# Patient Record
Sex: Male | Born: 1965 | Race: Black or African American | Hispanic: No | Marital: Married | Smoking: Current every day smoker
Health system: Southern US, Community
[De-identification: ages and names within clinical notes are randomized; demographics above are authoritative.]

## PROBLEM LIST (undated history)

## (undated) ENCOUNTER — Emergency Department (HOSPITAL_BASED_OUTPATIENT_CLINIC_OR_DEPARTMENT_OTHER): Payer: No Typology Code available for payment source | Source: Home / Self Care

## (undated) DIAGNOSIS — I10 Essential (primary) hypertension: Secondary | ICD-10-CM

## (undated) DIAGNOSIS — IMO0002 Reserved for concepts with insufficient information to code with codable children: Secondary | ICD-10-CM

## (undated) DIAGNOSIS — M549 Dorsalgia, unspecified: Secondary | ICD-10-CM

## (undated) HISTORY — PX: KNEE ARTHROSCOPY: SUR90

## (undated) HISTORY — PX: ANKLE FUSION: SHX881

---

## 1998-08-13 ENCOUNTER — Encounter: Payer: Self-pay | Admitting: Emergency Medicine

## 1998-08-13 ENCOUNTER — Emergency Department (HOSPITAL_COMMUNITY): Admission: EM | Admit: 1998-08-13 | Discharge: 1998-08-13 | Payer: Self-pay | Admitting: Emergency Medicine

## 1999-03-05 ENCOUNTER — Emergency Department (HOSPITAL_COMMUNITY): Admission: EM | Admit: 1999-03-05 | Discharge: 1999-03-05 | Payer: Self-pay | Admitting: Emergency Medicine

## 1999-03-05 ENCOUNTER — Encounter: Payer: Self-pay | Admitting: Emergency Medicine

## 1999-05-20 ENCOUNTER — Emergency Department (HOSPITAL_COMMUNITY): Admission: EM | Admit: 1999-05-20 | Discharge: 1999-05-20 | Payer: Self-pay | Admitting: Emergency Medicine

## 1999-05-25 ENCOUNTER — Emergency Department (HOSPITAL_COMMUNITY): Admission: EM | Admit: 1999-05-25 | Discharge: 1999-05-25 | Payer: Self-pay | Admitting: *Deleted

## 2000-09-19 ENCOUNTER — Emergency Department (HOSPITAL_COMMUNITY): Admission: EM | Admit: 2000-09-19 | Discharge: 2000-09-19 | Payer: Self-pay | Admitting: *Deleted

## 2001-04-07 ENCOUNTER — Inpatient Hospital Stay (HOSPITAL_COMMUNITY): Admission: EM | Admit: 2001-04-07 | Discharge: 2001-04-10 | Payer: Self-pay | Admitting: Emergency Medicine

## 2001-04-07 ENCOUNTER — Encounter: Payer: Self-pay | Admitting: Emergency Medicine

## 2001-04-07 ENCOUNTER — Encounter: Payer: Self-pay | Admitting: Orthopedic Surgery

## 2001-04-08 ENCOUNTER — Encounter: Payer: Self-pay | Admitting: Orthopedic Surgery

## 2001-08-05 ENCOUNTER — Encounter: Payer: Self-pay | Admitting: Emergency Medicine

## 2001-08-05 ENCOUNTER — Emergency Department (HOSPITAL_COMMUNITY): Admission: EM | Admit: 2001-08-05 | Discharge: 2001-08-05 | Payer: Self-pay | Admitting: Emergency Medicine

## 2001-12-21 ENCOUNTER — Emergency Department (HOSPITAL_COMMUNITY): Admission: EM | Admit: 2001-12-21 | Discharge: 2001-12-22 | Payer: Self-pay | Admitting: Emergency Medicine

## 2001-12-21 ENCOUNTER — Encounter: Payer: Self-pay | Admitting: Emergency Medicine

## 2002-06-09 ENCOUNTER — Emergency Department (HOSPITAL_COMMUNITY): Admission: EM | Admit: 2002-06-09 | Discharge: 2002-06-09 | Payer: Self-pay | Admitting: Emergency Medicine

## 2002-06-09 ENCOUNTER — Encounter: Payer: Self-pay | Admitting: Emergency Medicine

## 2002-11-10 ENCOUNTER — Emergency Department (HOSPITAL_COMMUNITY): Admission: EM | Admit: 2002-11-10 | Discharge: 2002-11-10 | Payer: Self-pay | Admitting: *Deleted

## 2002-12-02 ENCOUNTER — Emergency Department (HOSPITAL_COMMUNITY): Admission: EM | Admit: 2002-12-02 | Discharge: 2002-12-02 | Payer: Self-pay | Admitting: Emergency Medicine

## 2002-12-02 ENCOUNTER — Encounter: Payer: Self-pay | Admitting: Emergency Medicine

## 2004-07-18 ENCOUNTER — Ambulatory Visit: Payer: Self-pay | Admitting: Nurse Practitioner

## 2004-07-18 ENCOUNTER — Ambulatory Visit: Payer: Self-pay | Admitting: *Deleted

## 2004-09-14 ENCOUNTER — Emergency Department (HOSPITAL_COMMUNITY): Admission: EM | Admit: 2004-09-14 | Discharge: 2004-09-14 | Payer: Self-pay | Admitting: Emergency Medicine

## 2005-07-30 ENCOUNTER — Ambulatory Visit: Payer: Self-pay | Admitting: Internal Medicine

## 2006-01-11 ENCOUNTER — Ambulatory Visit: Payer: Self-pay | Admitting: Internal Medicine

## 2006-10-15 ENCOUNTER — Emergency Department (HOSPITAL_COMMUNITY): Admission: EM | Admit: 2006-10-15 | Discharge: 2006-10-15 | Payer: Self-pay | Admitting: Emergency Medicine

## 2007-07-24 ENCOUNTER — Emergency Department (HOSPITAL_COMMUNITY): Admission: EM | Admit: 2007-07-24 | Discharge: 2007-07-24 | Payer: Self-pay | Admitting: Emergency Medicine

## 2007-10-14 ENCOUNTER — Ambulatory Visit: Payer: Self-pay | Admitting: Internal Medicine

## 2007-10-14 ENCOUNTER — Ambulatory Visit (HOSPITAL_COMMUNITY): Admission: RE | Admit: 2007-10-14 | Discharge: 2007-10-14 | Payer: Self-pay | Admitting: Internal Medicine

## 2009-02-07 ENCOUNTER — Ambulatory Visit: Payer: Self-pay | Admitting: Internal Medicine

## 2009-02-12 ENCOUNTER — Ambulatory Visit (HOSPITAL_COMMUNITY): Admission: RE | Admit: 2009-02-12 | Discharge: 2009-02-12 | Payer: Self-pay | Admitting: Internal Medicine

## 2009-02-14 ENCOUNTER — Emergency Department (HOSPITAL_COMMUNITY): Admission: EM | Admit: 2009-02-14 | Discharge: 2009-02-14 | Payer: Self-pay | Admitting: Emergency Medicine

## 2009-02-21 ENCOUNTER — Ambulatory Visit: Payer: Self-pay | Admitting: Internal Medicine

## 2009-02-25 ENCOUNTER — Ambulatory Visit: Payer: Self-pay | Admitting: Internal Medicine

## 2009-02-26 ENCOUNTER — Encounter: Admission: RE | Admit: 2009-02-26 | Discharge: 2009-02-26 | Payer: Self-pay | Admitting: Internal Medicine

## 2009-03-07 ENCOUNTER — Ambulatory Visit: Payer: Self-pay | Admitting: Internal Medicine

## 2009-03-29 ENCOUNTER — Ambulatory Visit: Payer: Self-pay | Admitting: Internal Medicine

## 2009-04-24 ENCOUNTER — Encounter: Admission: RE | Admit: 2009-04-24 | Discharge: 2009-04-26 | Payer: Self-pay | Admitting: Internal Medicine

## 2009-04-28 ENCOUNTER — Emergency Department (HOSPITAL_COMMUNITY): Admission: EM | Admit: 2009-04-28 | Discharge: 2009-04-28 | Payer: Self-pay | Admitting: Emergency Medicine

## 2009-04-29 ENCOUNTER — Encounter: Admission: RE | Admit: 2009-04-29 | Discharge: 2009-05-21 | Payer: Self-pay | Admitting: Internal Medicine

## 2009-06-04 ENCOUNTER — Ambulatory Visit: Payer: Self-pay | Admitting: Internal Medicine

## 2009-10-17 ENCOUNTER — Emergency Department (HOSPITAL_COMMUNITY): Admission: EM | Admit: 2009-10-17 | Discharge: 2009-10-17 | Payer: Self-pay | Admitting: Family Medicine

## 2010-01-26 ENCOUNTER — Emergency Department (HOSPITAL_COMMUNITY): Admission: EM | Admit: 2010-01-26 | Discharge: 2010-01-26 | Payer: Self-pay | Admitting: Family Medicine

## 2010-04-17 ENCOUNTER — Emergency Department (HOSPITAL_COMMUNITY)
Admission: EM | Admit: 2010-04-17 | Discharge: 2010-04-17 | Payer: Self-pay | Source: Home / Self Care | Admitting: Family Medicine

## 2010-05-30 ENCOUNTER — Other Ambulatory Visit: Payer: Self-pay | Admitting: Family Medicine

## 2010-05-30 DIAGNOSIS — M5136 Other intervertebral disc degeneration, lumbar region: Secondary | ICD-10-CM

## 2010-06-02 ENCOUNTER — Ambulatory Visit
Admission: RE | Admit: 2010-06-02 | Discharge: 2010-06-02 | Disposition: A | Discharge status: Home / Self Care | Payer: Self-pay | Source: Ambulatory Visit | Attending: Family Medicine | Admitting: Family Medicine

## 2010-06-02 DIAGNOSIS — M5136 Other intervertebral disc degeneration, lumbar region: Secondary | ICD-10-CM

## 2010-07-13 LAB — POCT CARDIAC MARKERS
CKMB, poc: <1 ng/mL — ABNORMAL LOW (ref 1.0–8.0)
Myoglobin, poc: 20.6 ng/mL (ref 12–200)
Troponin i, poc: <0.05 ng/mL (ref 0.00–0.09)

## 2010-07-13 LAB — DIFFERENTIAL
Basophils Absolute: 0 10*3/uL (ref 0.0–0.1)
Basophils Relative: 0 % (ref 0–1)
Eosinophils Relative: 3 % (ref 0–5)
Lymphocytes Relative: 34 % (ref 12–46)
Monocytes Absolute: 0.6 10*3/uL (ref 0.1–1.0)

## 2010-07-13 LAB — D-DIMER, QUANTITATIVE: D-Dimer, Quant: <0.22 ug/mL-FEU (ref 0.00–0.48)

## 2010-07-13 LAB — CBC
Hemoglobin: 14.9 g/dL (ref 13.0–17.0)
MCHC: 34 g/dL (ref 30.0–36.0)
MCV: 90.8 fL (ref 78.0–100.0)
RDW: 14.2 % (ref 11.5–15.5)

## 2010-07-13 LAB — BASIC METABOLIC PANEL
Creatinine, Ser: 0.92 mg/dL (ref 0.4–1.5)
GFR calc non Af Amer: >60 mL/min (ref >60)
Sodium: 136 mEq/L (ref 135–145)

## 2010-07-14 LAB — GC/CHLAMYDIA PROBE AMP, URINE
Chlamydia, Swab/Urine, PCR: NEGATIVE
GC Probe Amp, Urine: NEGATIVE

## 2010-09-12 NOTE — Discharge Summary (Signed)
[place]. [facility]  Patient:    Brady Kim, Brady Kim Visit Number: [id number] MRN: [id number]          Service Type: MED Location: 5000 5009 01 Attending Physician:  Drema Pry Dictated by:   Anise Salvo. Clark, P.A.C. Admit Date:  04/07/2001 Discharge Date: 04/10/2001                             Discharge Summary  HOSPITAL COURSE:  Brady Kim was admitted to [facility] with a one-day history of right leg and ankle pain after suffering a fall off the roof while stringing up Christmas lights.  He felt immediate pain, there was swelling and ecchymosis on his ankle, he was unable to bear weight.  He presented to the emergency room where x-rays were taken revealing a right trimalleolar ankle fracture.  He was admitted to the hospital to Dr. Philipp Ovens service and set up for an open reduction internal fixation of his right trimalleolar ankle fracture on April 08, 2001.  He was brought to the operating room, a tourniquet was placed on his right leg.  The leg was prepped in a sterile fashion.  The tourniquet was inflated to 350 mmHg and the medial malleolus and lateral malleolus were openly reduced and fixed.  Total tourniquet time was one hour 30 minutes.  He was placed in a splint and made nonweightbearing, was admitted to the floor with PCA anesthesia.  Physical therapy was consulted to assist him with ambulation, he was given crutches.  Significant labs during his postoperative period revealed hemoglobin and hematocrit on December 12 were 15.1 and 44; on December 13 were 13.2 and 39; and on December 14 were 13.4 and 38.9.  On December 14 he was also noted to have a white blood count of 12.0 with 84% neutrophils.  His INR went from 1.0 on December 12 to 1.3 on December 13.  The remainder of his postoperative course is uneventful.  He was discharged on December 15.  MEDICATIONS UPON DISCHARGE:  Mepergan Fortis one or two tablets q.4-6h.  p.r.n. pain.  ACTIVITY:  To use crutches, no weightbearing on the right leg.  SPECIAL INSTRUCTIONS:  He was instructed to keep the dressing dry, keep his leg elevated, use ice as needed.  FOLLOW-UP:  Instructed to follow up at Dr. Philipp Ovens office in one week - he was to call for an appointment.  CONDITION UPON DISCHARGE:  Stable. Dictated by:   Anise Salvo. Clark, P.A.C. Attending Physician:  Drema Pry DD:  05/02/01 TD:  05/02/01 Job: [id number]

## 2010-09-12 NOTE — Op Note (Signed)
. [facility]  Patient:    Brady Kim, Brady Kim Visit Number: [id number] MRN: [id number]          Service Type: MED Location: 5000 5009 01 Attending Physician:  Drema Pry Dictated by:   Jearld Adjutant, M.D. Admit Date:  04/07/2001                             Operative Report  NO DICTATION. Dictated by:   Jearld Adjutant, M.D. Attending Physician:  Drema Pry DD:  04/08/01 TD:  04/09/01 Job: [id number]

## 2010-09-12 NOTE — H&P (Signed)
[place]. [facility]  Patient:    Brady Kim, Brady Kim Visit Number: [id number] MRN: [id number]          Service Type: MED Location: 5000 5009 01 Attending Physician:  Drema Pry Dictated by:   Kittie Plater Clark, P.A.-C. Admit Date:  04/07/2001                           History and Physical  CHIEF COMPLAINT:  Right leg/ankle pain x 1 day.  HISTORY OF PRESENT ILLNESS:  A 45 year old male fell off a roof while stringing Christmas lights last night.  He fell approximately 13 to 14 feet to ground.  Felt and heard a snap in his right ankle.  Felt immediate pain, swelling, and ecchymoses.  Was unable to bear weight on his right ankle.  He has no prior history of ankle trauma.  He has no hip pain, no knee pain, no pain on the side of the leg.  There is no loss of consciousness.  Suffered no head trauma.  He has had no headaches, no nausea, and no vomiting.  PAST MEDICAL HISTORY:  Denies any significant medical history.  Specifically denies any diabetes mellitus, heart disease, kidney disease, liver disease, of hypertension.  MEDICATIONS:  He occasionally takes Tylenol.  He denies other medications.  ALLERGIES:  He has no known drug allergies.  PHYSICIAN:  He states he does not have a primary doctor.  PAST SURGICAL HISTORY:  Significant for a left knee arthroscopy approximately 10 to 20 years ago for "torn cartilage."  SOCIAL HISTORY:  Tobacco:  Positive for one pack per day x 20 years.  Ethanol: Positive for two to three quarts or beer per day approximately three to seven times per week.  Denies any other drugs.  He lives on a one floor home.  There are five steps up to the front door.  He lives with his significant other.  REVIEW OF SYSTEMS:  He denies any ENT, cardiac, pulmonary, GI, or psychological symptoms.  He has had polyuria since he has been in the hospital.  This may be due to his IV and he has had excessive thirst but he has also been  NPO.  PHYSICAL EXAMINATION:  GENERAL:  Well-developed, well-nourished black male in no acute distress. Foot is splinted, iced, and elevated.  VITAL SIGNS:  Reviewed on the chart, temperature 98.2, pulse of 80, respiration 20, blood pressure 130/90.  HEENT:  NCAT.  PERRLA.  EOMI.  NECK:  Supple.  LUNGS:  Clear to auscultation.  HEART:  Regular rate and rhythm.  Normal S1 and S2.  No murmurs noted.  ABDOMEN:  Soft and nontender.  EXTREMITIES:  Right lower extremity motor and sensory to toes was intact. Capillary refill was less than one second.  His leg was iced and elevated. The right knee was not tender.  Right hip had a full range of motion as well.  LABORATORY DATA:  White count was 9, hemoglobin 13, hematocrit 39, platelets 204,000.  Sodium was 139, potassium 3.7, chloride 105, CO2 27, BUN 6, creatinine 0.8, glucose 97.  He had SGOT of 21, SGPT was elevated at 44, and alkaline phosphatase was elevated at 119.  PT/INR was 15.3/1.3.  PTT was 30. His urinalysis was negative.  X-rays revealed a trimalleolar fracture of the right ankle.  IMPRESSION:  Trimalleolar fracture of the right ankle.  PLAN:  Admit to [facility] for ORIF of right ankle. Dictated  by:   Kittie Plater Clark, P.A.-C. Attending Physician:  Drema Pry DD:  04/08/01 TD:  04/08/01 Job: [id number]

## 2010-09-12 NOTE — Op Note (Signed)
[place]. [facility]  Patient:    Brady Kim, Brady Kim Visit Number: [id number] MRN: [id number]          Service Type: MED Location: 5000 5009 01 Attending Physician:  Drema Pry Dictated by:   Jearld Adjutant, M.D. Admit Date:  04/07/2001                             Operative Report  NO DICTATION. Dictated by:   Jearld Adjutant, M.D. Attending Physician:  Drema Pry DD:  04/08/01 TD:  04/09/01 Job: [id number]

## 2010-09-12 NOTE — Op Note (Signed)
[place]. [facility]  Patient:    Brady Kim, Brady Kim Visit Number: [id number] MRN: [id number]          Service Type: MED Location: 5000 5009 01 Attending Physician:  Drema Pry Dictated by:   Jearld Adjutant, M.D. Proc. Date: 04/08/01 Admit Date:  04/07/2001   CC:         Jearld Adjutant, M.D.   Operative Report  PREOPERATIVE DIAGNOSIS:  Closed right trimalleolar right ankle fracture displaced.  POSTOPERATIVE DIAGNOSIS:  Closed right trimalleolar right ankle fracture displaced.  OPERATION:  Open reduction and internal fixation of bimalleolar component of trimalleolar fracture.  SURGEON:  Jearld Adjutant, M.D.  ASSISTANT:  Luretha Rued, P.A.-C.  ANESTHESIA:  General endotracheal anesthesia.  CULTURES:    None.  DRAINS:  None.  ESTIMATED BLOOD LOSS:  Minimal.  TOURNIQUET TIME:  1 hour and 30 minutes.  INDICATION:  Brady Kim is a 45 year old male who slipped off the roof putting Christmas lights up at a neighbors house sustaining this fracture.  MVS was intact.  He was splinted and admitted for elevation.  In surgery, no untorrid features were noted.  We fixed the medial malleolus with two 4.5 cannulated cancellous screws and the lateral malleolus with three lag screws in the spiral fracture-cortical type and an 8 hole one-third tubular plate contoured for the fibula to act as a buttress with 8 cortices proximal and two at or distal to the fracture site.  One was cancellous. Anatomic reduction was obtained.  On mortis and lateral view, the posterior malleolus was approximately 25% of the surface and reduced with the reduction.  DESCRIPTION OF PROCEDURE:  With excellent anesthesia obtained using endotracheal technique, 1 gm Ancef given IV prophylaxis.  The patient was placed in the supine position.  The right focus area was prepped from the toes to the knee in the standard fashion.  After standard prepping and draping, Esmarch  exsanguination was used and the tourniquet was let up to 350 mmHg.  We then made an incision over the medial malleolus.  Dissection carried down to the fracture site and clot evacuated and the joint flushed.  Debride was removed from the fracture as well as the periosteum.  The fracture was then reduced with the bone holding clamp and two guide pins were placed in the appropriate position.  C-arm fluoroscopy confirmed position and then two 46 mm 4.5 cannulated cancellous screws short thread were used to fix the medial malleolus anatomically.  Attention was then turned laterally where an incision was made over the distal fibula.  Incision was deep and sharp with the knife and hemostasis obtained using the Bovie electrocoagulator.  The spiral distal fracture had the debride removed and it was reduced with a bone-holding clamp.  Two anterior-posterior cortical lag screws were then placed with lag technique and one in the lateral medial plane more proximal.  C-arm fluoroscopy confirmed reduction and then a contoured eight hole semi-third tubular plate was placed with eight cortices proximal as above, contoured to fit the fibula.  C-arm fluoroscopy on mortis and true lateral position was taken.  We were satisfied with the reduction.  Irrigation was carried out in both wounds.  The wounds were closed in layers with 2-0 and 3-0 Vicryl and skin staples.  A bulky sterile compressive dressing was applied with posterior plaster splint with U stirrup, Ace, and Webril in neutral ankle position.  The patient, having tolerated the procedure well, was awakened and taken to the recovery room  in satisfactory condition to be admitted for routine postoperative care, nonweightbearing, crutch ambulation, and analgesia. Dictated by:   Jearld Adjutant, M.D. Attending Physician:  Drema Pry DD:  04/08/01 TD:  04/09/01 Job: [id number]

## 2010-12-21 ENCOUNTER — Inpatient Hospital Stay (INDEPENDENT_AMBULATORY_CARE_PROVIDER_SITE_OTHER)
Admission: RE | Admit: 2010-12-21 | Discharge: 2010-12-21 | Disposition: A | Discharge status: Home / Self Care | Payer: Self-pay | Source: Ambulatory Visit | Attending: Emergency Medicine | Admitting: Emergency Medicine

## 2010-12-21 DIAGNOSIS — M545 Low back pain: Secondary | ICD-10-CM

## 2010-12-21 DIAGNOSIS — R300 Dysuria: Secondary | ICD-10-CM

## 2010-12-21 LAB — POCT URINALYSIS DIP (DEVICE)
Hgb urine dipstick: NEGATIVE
Leukocytes, UA: NEGATIVE
Nitrite: NEGATIVE
pH: 6 (ref 5.0–8.0)

## 2010-12-22 LAB — GC/CHLAMYDIA PROBE AMP, GENITAL: GC Probe Amp, Genital: NEGATIVE

## 2011-05-30 ENCOUNTER — Emergency Department (HOSPITAL_COMMUNITY)
Admission: EM | Admit: 2011-05-30 | Discharge: 2011-05-30 | Disposition: A | Discharge status: Home / Self Care | Payer: Self-pay | Source: Home / Self Care | Attending: Family Medicine | Admitting: Family Medicine

## 2011-05-30 ENCOUNTER — Encounter (HOSPITAL_COMMUNITY): Payer: Self-pay | Admitting: *Deleted

## 2011-05-30 DIAGNOSIS — K047 Periapical abscess without sinus: Secondary | ICD-10-CM

## 2011-05-30 HISTORY — DX: Dorsalgia, unspecified: M54.9

## 2011-05-30 HISTORY — DX: Reserved for concepts with insufficient information to code with codable children: IMO0002

## 2011-05-30 MED ORDER — HYDROCODONE-ACETAMINOPHEN 5-325 MG PO TABS
2.0000 | ORAL_TABLET | Freq: Once | ORAL | Status: AC
  Administered 2011-05-30: 2 via ORAL

## 2011-05-30 MED ORDER — HYDROCODONE-ACETAMINOPHEN 5-325 MG PO TABS
ORAL_TABLET | ORAL | Status: AC
  Filled 2011-05-30: qty 2

## 2011-05-30 MED ORDER — HYDROCODONE-ACETAMINOPHEN 5-325 MG PO TABS: ORAL_TABLET | ORAL | Status: AC

## 2011-05-30 MED ORDER — CLINDAMYCIN HCL 150 MG PO CAPS: 150.0000 mg | ORAL_CAPSULE | Freq: Four times a day (QID) | ORAL | Status: AC

## 2011-05-30 NOTE — ED Notes (Signed)
Pt with c/o right upper toothache onset yesterday this am with swelling right side face increased pain

## 2011-05-30 NOTE — ED Provider Notes (Signed)
History     CSN: [id number]  Arrival date & time 05/30/11  1222   First MD Initiated Contact with Patient 05/30/11 1348      Chief Complaint  Patient presents with  . Dental Pain  . Oral Swelling    (Consider location/radiation/quality/duration/timing/severity/associated sxs/prior treatment) HPI Comments: Brady Kim presents for evaluation of sudden onset of pain and swelling in the right upper jaw. He reports that this started yesterday suddenly. He denies any specific injury but does have a history of poor dentition.  Patient is a 46 y.o. male presenting with tooth pain. The history is provided by the patient.  Dental PainThe primary symptoms include mouth pain. Primary symptoms do not include dental injury. The symptoms began yesterday. The symptoms are worsening. The symptoms are new.  Mouth pain occurs constantly. Mouth pain is unchanged. Affected locations include: gum(s) and cheek(s).  Additional symptoms include: gum swelling, gum tenderness and facial swelling. Additional symptoms do not include: purulent gums and dry mouth. Medical issues include: smoking and periodontal disease.    Past Medical History  Diagnosis Date  . Back pain   . Degenerative disc disease     Past Surgical History  Procedure Date  . Knee arthroscopy   . Ankle fusion     History reviewed. No pertinent family history.  History  Substance Use Topics  . Smoking status: Current Everyday Smoker  . Smokeless tobacco: Not on file  . Alcohol Use: No      Review of Systems  Constitutional: Negative.   HENT: Positive for facial swelling and dental problem.   Eyes: Negative.   Respiratory: Negative.   Cardiovascular: Negative.   Gastrointestinal: Negative.   Genitourinary: Negative.   Musculoskeletal: Negative.   Skin: Negative.   Neurological: Negative.     Allergies  Review of patient's allergies indicates no known allergies.  Home Medications   Current Outpatient Rx  Name Route Sig  Dispense Refill  . BENZOCAINE 10 % MT GEL Mouth/Throat Use as directed in the mouth or throat as needed.    Marland Kitchen CLINDAMYCIN HCL 150 MG PO CAPS Oral Take 1 capsule (150 mg total) by mouth every 6 (six) hours. 28 capsule 0  . HYDROCODONE-ACETAMINOPHEN 5-325 MG PO TABS  Take one to two tablets every 4 to 6 hours as needed for pain 20 tablet 0    BP 125/74  Pulse 93  Temp(Src) 99.4 F (37.4 C) (Oral)  Resp 16  SpO2 98%  Physical Exam  Nursing note and vitals reviewed. Constitutional: He is oriented to person, place, and time. He appears well-developed and well-nourished.  HENT:  Head: Normocephalic and atraumatic.  Mouth/Throat: Uvula is midline and mucous membranes are normal. Abnormal dentition. Dental abscesses and dental caries present.  Eyes: EOM are normal.  Neck: Normal range of motion.  Pulmonary/Chest: Effort normal.  Musculoskeletal: Normal range of motion.  Neurological: He is alert and oriented to person, place, and time.  Skin: Skin is warm and dry.  Psychiatric: His behavior is normal.    ED Course  Procedures (including critical care time)  Labs Reviewed - No data to display No results found.   1. Periapical abscess       MDM  rx given for clindamycin, hydrocodone and referred to [facility]; given contact information       Richardo Priest, MD 05/30/11 1438

## 2011-11-06 ENCOUNTER — Other Ambulatory Visit (HOSPITAL_COMMUNITY): Payer: Self-pay | Admitting: Internal Medicine

## 2011-11-06 DIAGNOSIS — R519 Headache, unspecified: Secondary | ICD-10-CM

## 2011-11-09 ENCOUNTER — Other Ambulatory Visit (HOSPITAL_COMMUNITY): Payer: Self-pay | Admitting: Internal Medicine

## 2011-11-09 ENCOUNTER — Other Ambulatory Visit (HOSPITAL_COMMUNITY): Payer: Self-pay | Admitting: Family Medicine

## 2011-11-09 ENCOUNTER — Ambulatory Visit (HOSPITAL_COMMUNITY)
Admission: RE | Admit: 2011-11-09 | Discharge: 2011-11-09 | Disposition: A | Discharge status: Home / Self Care | Payer: Self-pay | Source: Ambulatory Visit | Attending: Internal Medicine | Admitting: Internal Medicine

## 2011-11-09 DIAGNOSIS — R51 Headache: Secondary | ICD-10-CM | POA: Insufficient documentation

## 2012-12-11 ENCOUNTER — Emergency Department (HOSPITAL_COMMUNITY)
Admission: EM | Admit: 2012-12-11 | Discharge: 2012-12-11 | Disposition: A | Discharge status: Home / Self Care | Payer: Self-pay | Attending: Emergency Medicine | Admitting: Emergency Medicine

## 2012-12-11 ENCOUNTER — Encounter (HOSPITAL_COMMUNITY): Payer: Self-pay | Admitting: Emergency Medicine

## 2012-12-11 DIAGNOSIS — M549 Dorsalgia, unspecified: Secondary | ICD-10-CM

## 2012-12-11 DIAGNOSIS — R52 Pain, unspecified: Secondary | ICD-10-CM | POA: Insufficient documentation

## 2012-12-11 DIAGNOSIS — Z87828 Personal history of other (healed) physical injury and trauma: Secondary | ICD-10-CM | POA: Insufficient documentation

## 2012-12-11 DIAGNOSIS — Z8669 Personal history of other diseases of the nervous system and sense organs: Secondary | ICD-10-CM | POA: Insufficient documentation

## 2012-12-11 DIAGNOSIS — F17200 Nicotine dependence, unspecified, uncomplicated: Secondary | ICD-10-CM | POA: Insufficient documentation

## 2012-12-11 DIAGNOSIS — G8929 Other chronic pain: Secondary | ICD-10-CM | POA: Insufficient documentation

## 2012-12-11 DIAGNOSIS — R209 Unspecified disturbances of skin sensation: Secondary | ICD-10-CM | POA: Insufficient documentation

## 2012-12-11 DIAGNOSIS — M545 Low back pain, unspecified: Secondary | ICD-10-CM | POA: Insufficient documentation

## 2012-12-11 DIAGNOSIS — Z8739 Personal history of other diseases of the musculoskeletal system and connective tissue: Secondary | ICD-10-CM | POA: Insufficient documentation

## 2012-12-11 MED ORDER — TRAMADOL HCL 50 MG PO TABS: 50.0000 mg | ORAL_TABLET | Freq: Four times a day (QID) | ORAL | Status: DC | PRN
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
## 2012-12-11 NOTE — ED Notes (Signed)
Pt alert, arrives from home, c/o low back pain onset chronic in nature, denies recent trauma or injury, resp even unlabored, skin pwd

## 2012-12-13 NOTE — ED Provider Notes (Signed)
Medical screening examination/treatment/procedure(s) were performed by non-physician practitioner and as supervising physician I was immediately available for consultation/collaboration.   Laquiesha Piacente W. Glendola Friedhoff, MD 12/13/12 0853 

## 2013-02-20 ENCOUNTER — Ambulatory Visit: Payer: No Typology Code available for payment source | Attending: Internal Medicine

## 2013-03-10 ENCOUNTER — Ambulatory Visit: Payer: No Typology Code available for payment source | Attending: Internal Medicine | Admitting: Internal Medicine

## 2013-03-10 ENCOUNTER — Encounter: Payer: Self-pay | Admitting: Internal Medicine

## 2013-03-10 VITALS — BP 128/86 | HR 75 | Temp 98.3°F | Resp 16 | Ht 71.0 in | Wt 154.0 lb

## 2013-03-10 DIAGNOSIS — M545 Low back pain: Secondary | ICD-10-CM

## 2013-03-10 DIAGNOSIS — Z0000 Encounter for general adult medical examination without abnormal findings: Secondary | ICD-10-CM

## 2013-03-10 DIAGNOSIS — Z23 Encounter for immunization: Secondary | ICD-10-CM

## 2013-03-10 MED ORDER — ALBUTEROL SULFATE HFA 108 (90 BASE) MCG/ACT IN AERS: 2.0000 | INHALATION_SPRAY | Freq: Four times a day (QID) | RESPIRATORY_TRACT | Status: DC | PRN

## 2013-03-10 MED ORDER — NAPROXEN 500 MG PO TABS: 500.0000 mg | ORAL_TABLET | Freq: Two times a day (BID) | ORAL | Status: DC

## 2013-03-10 MED ORDER — CARISOPRODOL 350 MG PO TABS: 350.0000 mg | ORAL_TABLET | Freq: Every day | ORAL | Status: DC

## 2013-03-10 MED ORDER — FAMOTIDINE 20 MG PO TABS: 20.0000 mg | ORAL_TABLET | Freq: Two times a day (BID) | ORAL | Status: DC

## 2013-03-10 NOTE — Progress Notes (Unsigned)
Pt is here to establish care. Pt needs forms to be filled out. He has chronic lumbar back pain and arthritis. He is requesting an MRI of his back.

## 2013-03-10 NOTE — Progress Notes (Unsigned)
Patient ID: Brady Kim, male   DOB: 02-09-1966, 47 y.o.   MRN: [id number]   HPI: This is a 47 year old male with history of chronic lower back pain who comes in to establish care. The patient states that he was previously seeing Dr. Roseanne Reno at the [facility] clinic and was getting prescriptions for narcotics. He is not asking for narcotics today. He states that pain is mostly just lateral to his lumbar spine on both sides but at times radiates down his legs. Currently he is not having any pain. He states that he injured L2/L3 many years ago due to the kind of work he does which includes heavy lifting and moving. Also brought in paperwork from the department of transportation for [place] to fill. He states that he's had a DUI in the past and he suspects this is why he is having this paperwork sent to him.  No Known Allergies Past Medical History  Diagnosis Date  . Back pain   . Degenerative disc disease     Family History  Problem Relation Age of Onset  . Hypertension Mother   . Diabetes Maternal Aunt   . Cancer Maternal Aunt   . Cancer Maternal Grandmother    History   Social History  . Marital Status: Married    Spouse Name: N/A    Number of Children: N/A  . Years of Education: N/A   Occupational History  . Not on file.   Social History Main Topics  . Smoking status: Current Every Day Smoker -- 0.50 packs/day    Types: Cigarettes  . Smokeless tobacco: Not on file  . Alcohol Use: No  . Drug Use: No  . Sexual Activity: Not on file   Other Topics Concern  . Not on file   Social History Narrative  . No narrative on file    Review of Systems  Constitutional: Negative for fever, chills, diaphoresis, activity change, appetite change and fatigue.  HENT: Negative for ear pain, nosebleeds, congestion, facial swelling, rhinorrhea, neck pain, neck stiffness and ear discharge.  Eyes: Negative for pain, discharge, redness, itching and visual disturbance.  Respiratory: Negative for  cough, choking, chest tightness, shortness of breath, wheezing and stridor.  Cardiovascular: Negative for chest pain, palpitations and leg swelling.  Gastrointestinal: Negative for abdominal distention.  Genitourinary: Negative for dysuria, urgency, frequency, hematuria, flank pain, decreased urine volume, difficulty urinating and dyspareunia.  Musculoskeletal: positive for back pain, no joint swelling, arthralgias or gait problem.  Neurological: Negative for dizziness, tremors, seizures, syncope, facial asymmetry, speech difficulty, weakness, light-headedness, numbness and headaches.  Hematological: Negative for adenopathy. Does not bruise/bleed easily.  Psychiatric/Behavioral: Negative for hallucinations, behavioral problems, confusion, dysphoric mood, decreased concentration and agitation.    Objective:   Filed Vitals:   03/10/13 1543  BP: 128/86  Pulse: 75  Temp: 98.3 F (36.8 C)  Resp: 16   Filed Weights   03/10/13 1543  Weight: 154 lb (69.854 kg)    Physical Exam ______ Constitutional: Appears well-developed and well-nourished. No distress. ____ HENT: Normocephalic. External right and left ear normal. Oropharynx is clear and moist. ____ Eyes: Conjunctivae and EOM are normal. PERRLA, no scleral icterus. ____ Neck: Normal ROM. Neck supple. No JVD. No tracheal deviation. No thyromegaly. ____ CVS: RRR, S1/S2 +, no murmurs, no gallops, no carotid bruit.  Pulmonary: Effort and breath sounds normal, no stridor, rhonchi, wheezes, rales.  Abdominal: Soft. BS +,  no distension, tenderness, rebound or guarding. ________ Musculoskeletal: Normal range of motion. No edema  and no tenderness. Mild tenderness in the paraspinal area of upper lumbar spine.  Neuro: Alert. Normal reflexes, muscle tone coordination. No cranial nerve deficit. Skin: Skin is warm and dry. No rash noted. Not diaphoretic. No erythema. No pallor. ____ Psychiatric: Normal mood and affect. Behavior, judgment, thought  content normal. __  Lab Results  Component Value Date   WBC 6.7 04/28/2009   HGB 14.9 04/28/2009   HCT 44.0 04/28/2009   MCV 90.8 04/28/2009   PLT 191 04/28/2009   Lab Results  Component Value Date   CREATININE 0.92 04/28/2009   BUN 7 04/28/2009   NA 136 04/28/2009   K 4.0 04/28/2009   CL 102 04/28/2009   CO2 28 04/28/2009    No results found for this basename: HGBA1C   Lipid Panel  No results found for this basename: chol, trig, hdl, cholhdl, vldl, ldlcalc        There are no active problems to display for this patient.    Preventative Medicine:  Flu vaccine: Will be administered   LABS: The following will be performed when he returns in a fasting state Metabolic panel: CBC:  Vitamin D : Lipid Panel: TSH:    Assessment and plan:  Lower back pain -Start Naprosyn twice a day along with Pepcid and soma (only at bedtime) -Referral to sports medicine and physical therapy -X-ray lumbar spine  Paperwork for a Department of Transportation will be filled and returned to him on the day that he comes in for his lab work.  Nicotine abuse Counseling has been given  Calvert Cantor, MD

## 2013-03-13 ENCOUNTER — Ambulatory Visit (HOSPITAL_COMMUNITY)
Admission: RE | Admit: 2013-03-13 | Discharge: 2013-03-13 | Disposition: A | Discharge status: Home / Self Care | Payer: No Typology Code available for payment source | Source: Ambulatory Visit | Attending: Internal Medicine | Admitting: Internal Medicine

## 2013-03-13 DIAGNOSIS — M545 Low back pain, unspecified: Secondary | ICD-10-CM

## 2013-03-13 DIAGNOSIS — Z0000 Encounter for general adult medical examination without abnormal findings: Secondary | ICD-10-CM

## 2013-03-13 DIAGNOSIS — M79609 Pain in unspecified limb: Secondary | ICD-10-CM | POA: Insufficient documentation

## 2013-03-14 ENCOUNTER — Other Ambulatory Visit: Payer: Self-pay | Admitting: Internal Medicine

## 2013-03-14 ENCOUNTER — Ambulatory Visit: Payer: No Typology Code available for payment source | Attending: Internal Medicine

## 2013-03-14 DIAGNOSIS — Z0000 Encounter for general adult medical examination without abnormal findings: Secondary | ICD-10-CM

## 2013-03-14 DIAGNOSIS — M545 Low back pain: Secondary | ICD-10-CM

## 2013-03-14 LAB — BASIC METABOLIC PANEL
CO2: 30 mEq/L (ref 19–32)
Chloride: 102 mEq/L (ref 96–112)
Creat: 0.95 mg/dL (ref 0.50–1.35)
Glucose, Bld: 96 mg/dL (ref 70–99)
Potassium: 4.4 mEq/L (ref 3.5–5.3)

## 2013-03-14 LAB — LIPID PANEL
Cholesterol: 101 mg/dL (ref 0–200)
HDL: 46 mg/dL (ref >39)
LDL Cholesterol: 35 mg/dL (ref 0–99)
Triglycerides: 102 mg/dL (ref <150)
VLDL: 20 mg/dL (ref 0–40)

## 2013-03-14 LAB — CBC WITH DIFFERENTIAL/PLATELET
Basophils Relative: 1 % (ref 0–1)
Eosinophils Absolute: 0.3 10*3/uL (ref 0.0–0.7)
Eosinophils Relative: 4 % (ref 0–5)
Hemoglobin: 15.2 g/dL (ref 13.0–17.0)
MCH: 29.9 pg (ref 26.0–34.0)
MCHC: 34.7 g/dL (ref 30.0–36.0)
MCV: 86.1 fL (ref 78.0–100.0)
Monocytes Absolute: 0.5 10*3/uL (ref 0.1–1.0)
Monocytes Relative: 7 % (ref 3–12)
Neutrophils Relative %: 61 % (ref 43–77)
RBC: 5.09 MIL/uL (ref 4.22–5.81)
WBC: 6.4 10*3/uL (ref 4.0–10.5)

## 2013-03-14 LAB — PSA: PSA: 1.05 ng/mL (ref <=4.00)

## 2013-03-14 LAB — TSH: TSH: 1.766 u[IU]/mL (ref 0.350–4.500)

## 2013-03-14 LAB — HEMOGLOBIN A1C: Mean Plasma Glucose: 123 mg/dL — ABNORMAL HIGH (ref <117)

## 2013-03-15 ENCOUNTER — Encounter: Payer: Self-pay | Admitting: Family Medicine

## 2013-03-15 ENCOUNTER — Ambulatory Visit (INDEPENDENT_AMBULATORY_CARE_PROVIDER_SITE_OTHER): Payer: No Typology Code available for payment source | Admitting: Family Medicine

## 2013-03-15 VITALS — BP 113/79 | HR 101 | Ht 71.0 in | Wt 154.0 lb

## 2013-03-15 DIAGNOSIS — G8929 Other chronic pain: Secondary | ICD-10-CM

## 2013-03-15 DIAGNOSIS — M545 Low back pain: Secondary | ICD-10-CM

## 2013-03-15 MED ORDER — PREDNISONE (PAK) 10 MG PO TABS: ORAL_TABLET | ORAL | Status: DC

## 2013-03-15 NOTE — Patient Instructions (Addendum)
You have underlying arthritis and a herniated disc in your back from prior MRI. We will get an updated MRI to see if this has progressed. Take tylenol for baseline pain relief (1-2 extra strength tabs 3x/day) A prednisone dose pack is the best option for immediate relief and may be prescribed. Day after finishing prednisone start aleve 2 tabs twice a day with food for pain and inflammation. Continue soma as needed for muscle spasms. Stay as active as possible. Physical therapy has been shown to be helpful as well - consider repeating this. Strengthening of low back muscles, abdominal musculature are key for long term pain relief. We will look into a neurosurgery referral but this is difficult through the [facility] system.

## 2013-03-16 ENCOUNTER — Encounter: Payer: Self-pay | Admitting: Family Medicine

## 2013-03-16 DIAGNOSIS — G8929 Other chronic pain: Secondary | ICD-10-CM | POA: Insufficient documentation

## 2013-03-16 LAB — DRUGS OF ABUSE SCREEN W/O ALC, ROUTINE URINE
Amphetamine Screen, Ur: NEGATIVE
Barbiturate Quant, Ur: NEGATIVE
Benzodiazepines.: NEGATIVE
Cocaine Metabolites: NEGATIVE
Creatinine,U: 189 mg/dL
Marijuana Metabolite: NEGATIVE
Phencyclidine (PCP): NEGATIVE
Propoxyphene: NEGATIVE

## 2013-03-16 NOTE — Progress Notes (Addendum)
Patient ID: Brady Kim, male   DOB: Oct 05, 1965, 47 y.o.   MRN: [id number]  PCP: Jeanann Lewandowsky, MD  Subjective:   HPI: Patient is a 47 y.o. male here for low back pain.  Patient has struggled with back pain for over 7 years. Constant pain every day with worsening at times. Does a lot of lifting, moving furniture, working on cars. Occasional sharp pain into left leg with tingling. No bowel/bladder dysfunction. Has tried multiple things for this in past including 4 ESIs, physical therapy (states this made it worse), nsaids, muscle relaxants, pain medications.  Past Medical History  Diagnosis Date  . Back pain   . Degenerative disc disease     Current Outpatient Prescriptions on File Prior to Visit  Medication Sig Dispense Refill  . benzocaine (ORAJEL) 10 % mucosal gel Use as directed in the mouth or throat as needed.      . carisoprodol (SOMA) 350 MG tablet Take 1 tablet (350 mg total) by mouth at bedtime.  30 tablet  0  . famotidine (PEPCID) 20 MG tablet Take 1 tablet (20 mg total) by mouth 2 (two) times daily.  60 tablet  6  . naproxen (NAPROSYN) 500 MG tablet Take 1 tablet (500 mg total) by mouth 2 (two) times daily with a meal.  30 tablet  6  . traMADol (ULTRAM) 50 MG tablet Take 1 tablet (50 mg total) by mouth every 6 (six) hours as needed for pain.  20 tablet  0   No current facility-administered medications on file prior to visit.    Past Surgical History  Procedure Laterality Date  . Knee arthroscopy    . Ankle fusion      No Known Allergies  History   Social History  . Marital Status: Married    Spouse Name: N/A    Number of Children: N/A  . Years of Education: N/A   Occupational History  . Not on file.   Social History Main Topics  . Smoking status: Current Every Day Smoker -- 1.00 packs/day    Types: Cigarettes  . Smokeless tobacco: Not on file  . Alcohol Use: No  . Drug Use: No  . Sexual Activity: Not on file   Other Topics Concern  . Not on  file   Social History Narrative  . No narrative on file    Family History  Problem Relation Age of Onset  . Hypertension Mother   . Diabetes Maternal Aunt   . Cancer Maternal Aunt   . Cancer Maternal Grandmother     BP 113/79  Pulse 101  Ht 5\' 11"  (1.803 m)  Wt 154 lb (69.854 kg)  BMI 21.49 kg/m2  Review of Systems: See HPI above.    Objective:  Physical Exam:  Gen: NAD  Back: No gross deformity, scoliosis. TTP just to sides of midline in low lumbar spine.  No midline or bony TTP. FROM with pain on flexion and extension. Strength LEs 5/5 all muscle groups.   2+ MSRs in patellar and achilles tendons, equal bilaterally. Negative SLRs. Sensation intact to light touch bilaterally. Negative logroll bilateral hips Negative fabers and piriformis stretches.    Assessment & Plan:  1. Chronic low back pain - from prior MRI in 2010 he had a small disc protrusion at L3-4 displacing left nerve root as well as advanced DDD at L4-5 with right sided disc protrusion.  He does get some radiation into left side.  Does not think he's tried prednisone in the  past so prescribed this.  Other conservative measures he has tried (ESIs, nsaids, muscle relaxants, pain medication).  Will repeat MRI to assess for any changes since last one.  Can revisit PT, ESIs, try to get in with a neurosurgeon though may be difficult as he has [employer] coverage.  Addendum:  MRI reviewed and discussed with patient.  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  He would like to go ahead with repeating PT, ESIs, and seeing a neurosurgeon.

## 2013-03-16 NOTE — Assessment & Plan Note (Signed)
from prior MRI in 2010 he had a small disc protrusion at L3-4 displacing left nerve root as well as advanced DDD at L4-5 with right sided disc protrusion.  He does get some radiation into left side.  Does not think he's tried prednisone in the past so prescribed this.  Other conservative measures he has tried (ESIs, nsaids, muscle relaxants, pain medication).  Will repeat MRI to assess for any changes since last one.  Can revisit PT, ESIs, try to get in with a neurosurgeon though may be difficult as he has [employer] coverage.

## 2013-03-18 ENCOUNTER — Ambulatory Visit (HOSPITAL_BASED_OUTPATIENT_CLINIC_OR_DEPARTMENT_OTHER)
Admission: RE | Admit: 2013-03-18 | Discharge: 2013-03-18 | Disposition: A | Discharge status: Home / Self Care | Payer: No Typology Code available for payment source | Source: Ambulatory Visit | Attending: Family Medicine | Admitting: Family Medicine

## 2013-03-18 DIAGNOSIS — M5126 Other intervertebral disc displacement, lumbar region: Secondary | ICD-10-CM | POA: Insufficient documentation

## 2013-03-18 DIAGNOSIS — M51379 Other intervertebral disc degeneration, lumbosacral region without mention of lumbar back pain or lower extremity pain: Secondary | ICD-10-CM | POA: Insufficient documentation

## 2013-03-18 DIAGNOSIS — M545 Low back pain, unspecified: Secondary | ICD-10-CM

## 2013-03-18 DIAGNOSIS — G8929 Other chronic pain: Secondary | ICD-10-CM | POA: Insufficient documentation

## 2013-03-18 DIAGNOSIS — M5137 Other intervertebral disc degeneration, lumbosacral region: Secondary | ICD-10-CM | POA: Insufficient documentation

## 2013-03-18 DIAGNOSIS — M47817 Spondylosis without myelopathy or radiculopathy, lumbosacral region: Secondary | ICD-10-CM | POA: Insufficient documentation

## 2013-03-21 ENCOUNTER — Ambulatory Visit: Payer: No Typology Code available for payment source | Attending: Internal Medicine | Admitting: Physical Therapy

## 2013-03-21 DIAGNOSIS — M545 Low back pain, unspecified: Secondary | ICD-10-CM | POA: Insufficient documentation

## 2013-03-21 DIAGNOSIS — R293 Abnormal posture: Secondary | ICD-10-CM | POA: Insufficient documentation

## 2013-03-21 DIAGNOSIS — IMO0001 Reserved for inherently not codable concepts without codable children: Secondary | ICD-10-CM | POA: Insufficient documentation

## 2013-03-21 NOTE — Addendum Note (Signed)
Addended by: Lenda Kelp on: 03/21/2013 01:04 PM   Modules accepted: Orders

## 2013-03-22 ENCOUNTER — Ambulatory Visit: Payer: No Typology Code available for payment source | Attending: Internal Medicine

## 2013-03-22 NOTE — Progress Notes (Unsigned)
Patient stated that he is beginning to have some anxiety because of his impending surgery.  CSW processed and discussed this concern with the patient.  CSW had limited resources for the patient but patient was able to share some of his stressors which he stated made him feel better.  CSW and patient will seek out other resources in order to see if there is a way to address patient's concerns.  CSW provided support and encouragement, which patient was receptive to.    Patient will follow up within the week.  Beverly Sessions MSW, LCSW 45 min duration

## 2013-03-24 ENCOUNTER — Other Ambulatory Visit: Payer: Self-pay | Admitting: Family Medicine

## 2013-03-24 DIAGNOSIS — M545 Low back pain: Secondary | ICD-10-CM

## 2013-03-27 ENCOUNTER — Ambulatory Visit
Admission: RE | Admit: 2013-03-27 | Discharge: 2013-03-27 | Disposition: A | Discharge status: Home / Self Care | Payer: No Typology Code available for payment source | Source: Ambulatory Visit | Attending: Family Medicine | Admitting: Family Medicine

## 2013-03-27 VITALS — BP 140/82 | HR 92

## 2013-03-27 DIAGNOSIS — M545 Low back pain: Secondary | ICD-10-CM

## 2013-03-27 MED ORDER — METHYLPREDNISOLONE ACETATE 40 MG/ML INJ SUSP (RADIOLOG
120.0000 mg | Freq: Once | INTRAMUSCULAR | Status: AC
  Administered 2013-03-27: 120 mg via EPIDURAL

## 2013-03-27 MED ORDER — IOHEXOL 180 MG/ML  SOLN
1.0000 mL | Freq: Once | INTRAMUSCULAR | Status: AC | PRN
  Administered 2013-03-27: 1 mL via EPIDURAL

## 2013-03-28 ENCOUNTER — Encounter: Payer: No Typology Code available for payment source | Admitting: Physical Therapy

## 2013-03-29 ENCOUNTER — Telehealth: Payer: Self-pay | Admitting: Family Medicine

## 2013-03-30 NOTE — Telephone Encounter (Signed)
I do not think patient is totally disabled.  I would put him on restrictions (no lifting more than 15 pounds, no stooping, crawling, bending) until he sees a neurosurgeon.  But I cannot tell him for sure he's going to have surgery - that would be up to a neurosurgeon.  He only has [employer] coverage and one not available to my knowledge at this time for a consultation.

## 2013-04-06 ENCOUNTER — Ambulatory Visit: Payer: No Typology Code available for payment source | Admitting: Internal Medicine

## 2013-05-10 ENCOUNTER — Ambulatory Visit: Payer: No Typology Code available for payment source

## 2013-05-25 ENCOUNTER — Ambulatory Visit: Payer: No Typology Code available for payment source | Attending: Internal Medicine | Admitting: Internal Medicine

## 2013-05-25 ENCOUNTER — Encounter: Payer: Self-pay | Admitting: Internal Medicine

## 2013-05-25 VITALS — BP 117/79 | HR 76 | Temp 98.7°F | Resp 16 | Ht 71.0 in | Wt 155.0 lb

## 2013-05-25 DIAGNOSIS — M545 Low back pain, unspecified: Secondary | ICD-10-CM | POA: Insufficient documentation

## 2013-05-25 DIAGNOSIS — G8929 Other chronic pain: Secondary | ICD-10-CM | POA: Insufficient documentation

## 2013-05-25 DIAGNOSIS — M79609 Pain in unspecified limb: Secondary | ICD-10-CM

## 2013-05-25 DIAGNOSIS — M79604 Pain in right leg: Secondary | ICD-10-CM

## 2013-05-25 MED ORDER — TRAMADOL HCL 50 MG PO TABS: 50.0000 mg | ORAL_TABLET | Freq: Four times a day (QID) | ORAL | Status: DC | PRN

## 2013-05-25 NOTE — Patient Instructions (Signed)
Herniated Disk The bones of your spinal column (vertebrae) protect your spinal cord and nerves that go into your arms and legs. The vertebrae are separated by disks that cushion the spinal column and put space between your vertebrae. This allows movement between the vertebrae, which allows you to bend, rotate, and move your body from side to side. Sometimes, the disks move out of place (herniate) or break open (rupture) from injury or strain. The most common area for a disk herniation is in the lower back (lumbar area). Sometimes herniation occurs in the neck (cervical) disks.  CAUSES  As we grow older, the strong, fibrous cords that connect the vertebrae and support and surround the disks (ligaments) start to weaken. A strain on the back may cause a break in the disk ligaments. RISK FACTORS Herniated disks occur most often in men who are aged 18 years to 35 years, usually after strenuous activity. Other risk factors include conditions present at birth (congenital) that affect the size of the lumbar spinal canal. Additionally, a narrowing of the areas where the nerves exit the spinal canal can occur as you age. SYMPTOMS  Symptoms of a herniated disk vary. You may have weakness in certain muscles. This weakness can include difficulty lifting your leg or arm, difficulty standing on your toes on one side, or difficulty squeezing tightly with one of your hands. You may have numbness. You may feel a mild tingling, dull ache, or a burning or pulsating pain. In some cases, the pain is severe enough that you are unable to move. The pain most often occurs on one side of the body. The pain often starts slowly. It may get worse:  After you sit or stand.  At night.  When you sneeze, cough, or laugh.  When you bend backwards or walk more than a few yards. The pain, numbness, or weakness will often go away or improve a lot over a period of weeks to months. Herniated lumbar disk Symptoms of a herniated lumbar  disk may include sharp pain in one part of your leg, hip, or buttocks and numbness in other parts. You also may feel pain or numbness on the back of your calf or the top or sole of your foot. The same leg also may feel weak. Herniated cervical disk Symptoms of a herniated cervical disk may include pain when you move your neck, deep pain near or over your shoulder blade, or pain that moves to your upper arm, forearm, or fingers. DIAGNOSIS  To diagnose a herniated disk, your caregiver will perform a physical exam. Your caregiver also may perform diagnostic tests to see your disk or to test the reaction of your muscles and the function of your nerves. During the physical exam, your caregiver may ask you to:  Sit, stand, and walk. While you walk, your caregiver may ask you to try walking on your toes and then your heels.  Bend forward, backward, and sideways.  Raise your shoulders, elbow, wrist, and fingers and check your strength during these tasks. Your caregiver will check for:  Numbness or loss of feeling.  Muscle reflexes, which may be slower or missing.  Muscle strength, which may be weaker.  Posture or the way your spine curves. Diagnostic tests that may be done include:  A spinal X-ray exam to rule out other causes of back pain.  Magnetic resonance imaging (MRI) or computed tomography (CT) scan, which will show if the herniated disk is pressing on your spinal canal.  Electromyography.   This is sometimes used to identify the specific area of nerve involvement. TREATMENT  Initial treatment for a herniated disk is a short period of rest with medicines for pain. Pain medicines can include nonsteroidal anti-inflammatory medicines (NSAIDs), muscle relaxants for back spasms, and (rarely) narcotic pain medicine for severe pain that does not respond to NSAID use. Bed rest is often limited to 1 or 2 days at the most because prolonged rest can delay recovery. When the herniation involves the  lower back, sitting should be avoided as much as possible because sitting increases pressure on the ruptured disk. Sometimes a soft neck collar will be prescribed for a few days to weeks to help support your neck in the case of a cervical herniation. Physical therapy is often prescribed for patients with disk disease. Physical therapists will teach you how to properly lift, dress, walk, and perform other activities. They will work on strengthening the muscles that help support your spine. In some cases, physical therapy alone is not enough to treat a herniated disk. Steroid injections along the involved nerve root may be needed to help control pain. The steroid is injected in the area of the herniated disk and helps by reducing swelling around the disk. Sometimes surgery is the best option to treat a herniated disk.  SEEK IMMEDIATE MEDICAL CARE IF:   You have numbness, tingling, weakness, or problems with the use of your arms or legs.  You have severe headaches that are not relieved with the use of medicines.  You notice a change in your bowel or bladder control.  You have increasing pain in any areas of your body.  You experience shortness of breath, dizziness, or fainting. MAKE SURE YOU:   Understand these instructions.  Will watch your condition.  Will get help right away if you are not doing well or get worse. Document Released: 04/10/2000 Document Revised: 07/06/2011 Document Reviewed: 11/14/2010 [employer] Patient Information 2014 [employer].  

## 2013-05-25 NOTE — Progress Notes (Signed)
Pt here to f/u pain control C/O right knee pain with swelling x 3 dys Referral placed neurosurgeon but not seen yet

## 2013-05-29 NOTE — Progress Notes (Signed)
Patient ID: Brady SledgeRonnie Kempton, male   DOB: 05/03/1965, 48 y.o.   MRN: [id number]   CC: Chronic back pain  HPI: Patient is 48 year old male who presents to clinic with main concern of chronic back pain, dull and intermittent, 5/10 in severity when present, present over 10 years. Patient explains he was referred to specialist in may need his surgery. He is compliant with appointments and recommended treatment. He denies fevers and chills, no focal neurological deficits, no abdominal or urinary concerns.  No Known Allergies Past Medical History  Diagnosis Date  . Back pain   . Degenerative disc disease    Current Outpatient Prescriptions on File Prior to Visit  Medication Sig Dispense Refill  . carisoprodol (SOMA) 350 MG tablet Take 1 tablet (350 mg total) by mouth at bedtime.  30 tablet  0  . famotidine (PEPCID) 20 MG tablet Take 1 tablet (20 mg total) by mouth 2 (two) times daily.  60 tablet  6  . naproxen (NAPROSYN) 500 MG tablet Take 1 tablet (500 mg total) by mouth 2 (two) times daily with a meal.  30 tablet  6  . benzocaine (ORAJEL) 10 % mucosal gel Use as directed in the mouth or throat as needed.      . predniSONE (STERAPRED UNI-PAK) 10 MG tablet 6 tabs po day 1, 5 tabs po day 2, 4 tabs po day 3, 3 tabs po day 4, 2 tabs po day 5, 1 tab po day 6  21 tablet  0   No current facility-administered medications on file prior to visit.   Family History  Problem Relation Age of Onset  . Hypertension Mother   . Diabetes Maternal Aunt   . Cancer Maternal Aunt   . Cancer Maternal Grandmother    History   Social History  . Marital Status: Married    Spouse Name: N/A    Number of Children: N/A  . Years of Education: N/A   Occupational History  . Not on file.   Social History Main Topics  . Smoking status: Current Every Day Smoker -- 1.00 packs/day    Types: Cigarettes  . Smokeless tobacco: Not on file  . Alcohol Use: No  . Drug Use: No  . Sexual Activity: Not on file   Other Topics  Concern  . Not on file   Social History Narrative  . No narrative on file    Review of Systems  Constitutional: Negative for fever, chills, diaphoresis, activity change, appetite change and fatigue.  HENT: Negative for ear pain, nosebleeds, congestion, facial swelling, rhinorrhea, neck pain, neck stiffness and ear discharge.   Eyes: Negative for pain, discharge, redness, itching and visual disturbance.  Respiratory: Negative for cough, choking, chest tightness, shortness of breath, wheezing and stridor.   Cardiovascular: Negative for chest pain, palpitations and leg swelling.  Gastrointestinal: Negative for abdominal distention.  Genitourinary: Negative for dysuria, urgency, frequency, hematuria, flank pain, decreased urine volume, difficulty urinating and dyspareunia.  Musculoskeletal: Negative for arthralgias and gait problem.  Neurological: Negative for dizziness, tremors, seizures, syncope, facial asymmetry, speech difficulty, weakness, light-headedness, numbness and headaches.  Hematological: Negative for adenopathy. Does not bruise/bleed easily.  Psychiatric/Behavioral: Negative for hallucinations, behavioral problems, confusion, dysphoric mood, decreased concentration and agitation.    Objective:   Filed Vitals:   05/25/13 1637  BP: 117/79  Pulse: 76  Temp: 98.7 F (37.1 C)  Resp: 16    Physical Exam  Constitutional: Appears well-developed and well-nourished. No distress.  HENT: Normocephalic. External right  and left ear normal. Oropharynx is clear and moist.  Eyes: Conjunctivae and EOM are normal. PERRLA, no scleral icterus.  Neck: Normal ROM. Neck supple. No JVD. No tracheal deviation. No thyromegaly.  CVS: RRR, S1/S2 +, no murmurs, no gallops, no carotid bruit.  Pulmonary: Effort and breath sounds normal, no stridor, rhonchi, wheezes, rales.  Abdominal: Soft. BS +,  no distension, tenderness, rebound or guarding.  Musculoskeletal: Normal range of motion. Significant  tenderness in paraspinal lumbar area  Lymphadenopathy: No lymphadenopathy noted, cervical, inguinal. Neuro: Alert. Normal reflexes, muscle tone coordination. No cranial nerve deficit. Skin: Skin is warm and dry. No rash noted. Not diaphoretic. No erythema. No pallor.  Psychiatric: Normal mood and affect. Behavior, judgment, thought content normal.   Lab Results  Component Value Date   WBC 6.4 03/14/2013   HGB 15.2 03/14/2013   HCT 43.8 03/14/2013   MCV 86.1 03/14/2013   PLT 257 03/14/2013   Lab Results  Component Value Date   CREATININE 0.95 03/14/2013   BUN 10 03/14/2013   NA 140 03/14/2013   K 4.4 03/14/2013   CL 102 03/14/2013   CO2 30 03/14/2013    Lab Results  Component Value Date   HGBA1C 5.9* 03/14/2013   Lipid Panel     Component Value Date/Time   CHOL 101 03/14/2013 0935   TRIG 102 03/14/2013 0935   HDL 46 03/14/2013 0935   CHOLHDL 2.2 03/14/2013 0935   VLDL 20 03/14/2013 0935   LDLCALC 35 03/14/2013 0935       Assessment and plan:   Patient Active Problem List   Diagnosis Date Noted  . Chronic low back pain 03/16/2013   - Encourage physical therapy if patient able to tolerate, also encouraged compliance with recommended treatment, continue analgesia as needed for pain control

## 2013-05-30 ENCOUNTER — Ambulatory Visit: Payer: No Typology Code available for payment source | Attending: Internal Medicine

## 2013-06-16 ENCOUNTER — Ambulatory Visit: Payer: No Typology Code available for payment source | Attending: Internal Medicine

## 2013-06-16 ENCOUNTER — Telehealth: Payer: Self-pay

## 2013-06-16 DIAGNOSIS — E119 Type 2 diabetes mellitus without complications: Secondary | ICD-10-CM

## 2013-06-16 LAB — GLUCOSE, POCT (MANUAL RESULT ENTRY): POC Glucose: 129 mg/dl — AB (ref 70–99)

## 2013-06-16 NOTE — Telephone Encounter (Signed)
Message copied by Lestine MountJUAREZ, Dayannara Pascal L on Fri Jun 16, 2013 10:41 AM ------      Message from: Calvert CantorIZWAN, SAIMA      Created: Fri Jun 16, 2013  9:43 AM       Hba1c boderline- has glucose intolerance. Please advise patient that he is a mild diabetic and will need to change eating habits to low carb.             He will need to lose weight through diet and exercise.             Please order a nutrition consult for patient. ------

## 2013-06-16 NOTE — Telephone Encounter (Signed)
Patient not available Left message on voice mail to return our call 

## 2013-06-16 NOTE — Progress Notes (Signed)
Quick Note:  Please let patient know that they are deficient in Vit D and prescribe 50,000 U Vit D weekly for 4 wks x 4 refills.  If they are unable to afford it, they can take Vit D2 OTC 2000 U daily. ______ 

## 2013-06-22 ENCOUNTER — Ambulatory Visit: Payer: No Typology Code available for payment source | Admitting: Internal Medicine

## 2013-06-27 ENCOUNTER — Encounter: Payer: Self-pay | Admitting: Internal Medicine

## 2013-06-27 ENCOUNTER — Ambulatory Visit: Payer: No Typology Code available for payment source | Attending: Internal Medicine | Admitting: Internal Medicine

## 2013-06-27 VITALS — BP 118/82 | HR 82 | Temp 98.6°F | Resp 17 | Wt 158.0 lb

## 2013-06-27 DIAGNOSIS — M25561 Pain in right knee: Secondary | ICD-10-CM

## 2013-06-27 DIAGNOSIS — M545 Low back pain, unspecified: Secondary | ICD-10-CM

## 2013-06-27 DIAGNOSIS — M25569 Pain in unspecified knee: Secondary | ICD-10-CM

## 2013-06-27 DIAGNOSIS — M549 Dorsalgia, unspecified: Secondary | ICD-10-CM | POA: Insufficient documentation

## 2013-06-27 DIAGNOSIS — F17200 Nicotine dependence, unspecified, uncomplicated: Secondary | ICD-10-CM

## 2013-06-27 DIAGNOSIS — G8929 Other chronic pain: Secondary | ICD-10-CM

## 2013-06-27 DIAGNOSIS — Z720 Tobacco use: Secondary | ICD-10-CM | POA: Insufficient documentation

## 2013-06-27 MED ORDER — NICOTINE 21 MG/24HR TD PT24: 21.0000 mg | MEDICATED_PATCH | Freq: Every day | TRANSDERMAL | Status: DC

## 2013-06-27 NOTE — Progress Notes (Signed)
Patient here for right knee pain and swelling for the past month

## 2013-06-27 NOTE — Progress Notes (Signed)
MRN: [id number] Name: Brady Kim  Sex: male Age: 48 y.o. DOB: Mar 02, 1966  Allergies: Review of patient's allergies indicates no known allergies.  Chief Complaint  Patient presents with  . Knee Pain    HPI: Patient is 48 y.o. male who comes today reported to have right knee pain for the last one month patient denies any fall or trauma, denies any fever chills had noticed some swelling and has some difficulty with full bending of the knee, patient also has history of chronic lower back pain and has already been referred to specialist, patient is taking pain medication when necessary, patient also smokes cigarettes, advised to quit smoking, is going to try nicotine patch.  Past Medical History  Diagnosis Date  . Back pain   . Degenerative disc disease     Past Surgical History  Procedure Laterality Date  . Knee arthroscopy    . Ankle fusion        Medication List       This list is accurate as of: 06/27/13  5:40 PM.  Always use your most recent med list.               benzocaine 10 % mucosal gel  Commonly known as:  ORAJEL  Use as directed in the mouth or throat as needed.     carisoprodol 350 MG tablet  Commonly known as:  SOMA  Take 1 tablet (350 mg total) by mouth at bedtime.     famotidine 20 MG tablet  Commonly known as:  PEPCID  Take 1 tablet (20 mg total) by mouth 2 (two) times daily.     naproxen 500 MG tablet  Commonly known as:  NAPROSYN  Take 1 tablet (500 mg total) by mouth 2 (two) times daily with a meal.     nicotine 21 mg/24hr patch  Commonly known as:  NICODERM CQ  Place 1 patch (21 mg total) onto the skin daily.     predniSONE 10 MG tablet  Commonly known as:  STERAPRED UNI-PAK  6 tabs po day 1, 5 tabs po day 2, 4 tabs po day 3, 3 tabs po day 4, 2 tabs po day 5, 1 tab po day 6     traMADol 50 MG tablet  Commonly known as:  ULTRAM  Take 1 tablet (50 mg total) by mouth every 6 (six) hours as needed.        Meds ordered this encounter   Medications  . nicotine (NICODERM CQ) 21 mg/24hr patch    Sig: Place 1 patch (21 mg total) onto the skin daily.    Dispense:  28 patch    Refill:  0    Immunization History  Administered Date(s) Administered  . Influenza,inj,Quad PF,36+ Mos 03/10/2013    Family History  Problem Relation Age of Onset  . Hypertension Mother   . Diabetes Maternal Aunt   . Cancer Maternal Aunt   . Cancer Maternal Grandmother     History  Substance Use Topics  . Smoking status: Current Every Day Smoker -- 1.00 packs/day    Types: Cigarettes  . Smokeless tobacco: Not on file  . Alcohol Use: No    Review of Systems   As noted in HPI  Filed Vitals:   06/27/13 1715  BP: 118/82  Pulse: 82  Temp: 98.6 F (37 C)  Resp: 17    Physical Exam  Physical Exam  Constitutional: No distress.  Eyes: EOM are normal. Pupils are equal, round, and  reactive to light.  Cardiovascular: Normal rate and regular rhythm.   Pulmonary/Chest: Breath sounds normal. No respiratory distress. He has no wheezes. He has no rales.  Musculoskeletal:  Right knee slightly swollen compared to left no warmth or tenderness, crepitation positive    CBC    Component Value Date/Time   WBC 6.4 03/14/2013 0935   RBC 5.09 03/14/2013 0935   HGB 15.2 03/14/2013 0935   HCT 43.8 03/14/2013 0935   PLT 257 03/14/2013 0935   MCV 86.1 03/14/2013 0935   LYMPHSABS 1.8 03/14/2013 0935   MONOABS 0.5 03/14/2013 0935   EOSABS 0.3 03/14/2013 0935   BASOSABS 0.0 03/14/2013 0935    CMP     Component Value Date/Time   NA 140 03/14/2013 0935   K 4.4 03/14/2013 0935   CL 102 03/14/2013 0935   CO2 30 03/14/2013 0935   GLUCOSE 96 03/14/2013 0935   BUN 10 03/14/2013 0935   CREATININE 0.95 03/14/2013 0935   CREATININE 0.92 04/28/2009 1618   CALCIUM 9.5 03/14/2013 0935   GFRNONAA >60 04/28/2009 1618   GFRAA  Value: >60        The eGFR has been calculated using the MDRD equation. This calculation has not been validated in all clinical  situations. eGFR's persistently <60 mL/min signify possible Chronic Kidney Disease. 04/28/2009 1618    Lab Results  Component Value Date/Time   CHOL 101 03/14/2013  9:35 AM    No components found with this basename: hga1c    No results found for this basename: AST    Assessment and Plan  Right knee pain - Plan: DG Knee Complete 4 Views Right, naproxen/tramadol when necessary for pain  Smoking - Plan: nicotine (NICODERM CQ) 21 mg/24hr patch  Chronic low back pain Pain medications when necessary. Patient has already been referred to neurosurgery.  Return in about 3 months (around 09/27/2013), or if symptoms worsen or fail to improve.  Lorayne Marek, MD

## 2013-06-28 ENCOUNTER — Ambulatory Visit (HOSPITAL_COMMUNITY)
Admission: RE | Admit: 2013-06-28 | Discharge: 2013-06-28 | Disposition: A | Discharge status: Home / Self Care | Payer: No Typology Code available for payment source | Source: Ambulatory Visit | Attending: Internal Medicine | Admitting: Internal Medicine

## 2013-06-28 DIAGNOSIS — M25569 Pain in unspecified knee: Secondary | ICD-10-CM | POA: Insufficient documentation

## 2013-06-28 DIAGNOSIS — M25561 Pain in right knee: Secondary | ICD-10-CM

## 2013-07-07 ENCOUNTER — Telehealth: Payer: Self-pay | Admitting: Internal Medicine

## 2013-09-12 ENCOUNTER — Encounter (HOSPITAL_COMMUNITY): Payer: Self-pay | Admitting: Emergency Medicine

## 2013-09-12 ENCOUNTER — Emergency Department (HOSPITAL_COMMUNITY)
Admission: EM | Admit: 2013-09-12 | Discharge: 2013-09-12 | Disposition: A | Discharge status: Home / Self Care | Payer: No Typology Code available for payment source | Attending: Emergency Medicine | Admitting: Emergency Medicine

## 2013-09-12 DIAGNOSIS — F17200 Nicotine dependence, unspecified, uncomplicated: Secondary | ICD-10-CM | POA: Insufficient documentation

## 2013-09-12 DIAGNOSIS — Z791 Long term (current) use of non-steroidal anti-inflammatories (NSAID): Secondary | ICD-10-CM | POA: Insufficient documentation

## 2013-09-12 DIAGNOSIS — Z79899 Other long term (current) drug therapy: Secondary | ICD-10-CM | POA: Insufficient documentation

## 2013-09-12 DIAGNOSIS — S61409A Unspecified open wound of unspecified hand, initial encounter: Secondary | ICD-10-CM | POA: Insufficient documentation

## 2013-09-12 DIAGNOSIS — S61431A Puncture wound without foreign body of right hand, initial encounter: Secondary | ICD-10-CM

## 2013-09-12 DIAGNOSIS — W268XXA Contact with other sharp object(s), not elsewhere classified, initial encounter: Secondary | ICD-10-CM | POA: Insufficient documentation

## 2013-09-12 DIAGNOSIS — Z8739 Personal history of other diseases of the musculoskeletal system and connective tissue: Secondary | ICD-10-CM | POA: Insufficient documentation

## 2013-09-12 DIAGNOSIS — Y9389 Activity, other specified: Secondary | ICD-10-CM | POA: Insufficient documentation

## 2013-09-12 DIAGNOSIS — Y929 Unspecified place or not applicable: Secondary | ICD-10-CM | POA: Insufficient documentation

## 2013-09-12 MED ORDER — IBUPROFEN 800 MG PO TABS: 800.0000 mg | ORAL_TABLET | Freq: Three times a day (TID) | ORAL | Status: DC

## 2013-09-12 NOTE — ED Provider Notes (Signed)
CSN: [id number]     Arrival date & time 09/12/13  1901 History   This chart was scribed for Johnnette Gourdobyn Albert, PA working with Suzi RootsKevin E Steinl, MD, by Bronson CurbJacqueline Melvin, ED Scribe. This patient was seen in room WTR5/WTR5 and the patient's care was started at 7:15 PM.    Chief Complaint  Patient presents with  . Hand Injury     The history is provided by the patient. No language interpreter was used.   HPI Comments: Brady Kim is a 48 y.o. male who presents to the Emergency Department complaining of right hand injury that occurred at 1 PM today. Patient states he was tearing down a shed and a nail punctured his right hand when he grabbed a wooden board. There is associated moderate right hand pain and swelling. Patient states he used alcohol pads and duct tape to control the bleeding. Patient is UTD on tetanus. States the nail is still in the shed, no pieces broke off.   Past Medical History  Diagnosis Date  . Back pain   . Degenerative disc disease    Past Surgical History  Procedure Laterality Date  . Knee arthroscopy    . Ankle fusion     Family History  Problem Relation Age of Onset  . Hypertension Mother   . Diabetes Maternal Aunt   . Cancer Maternal Aunt   . Cancer Maternal Grandmother    History  Substance Use Topics  . Smoking status: Current Every Day Smoker -- 1.00 packs/day    Types: Cigarettes  . Smokeless tobacco: Not on file  . Alcohol Use: No    Review of Systems  Skin: Positive for wound.  All other systems reviewed and are negative.     Allergies  Review of patient's allergies indicates no known allergies.  Home Medications   Prior to Admission medications   Medication Sig Start Date End Date Taking? Authorizing Provider  benzocaine (ORAJEL) 10 % mucosal gel Use as directed in the mouth or throat as needed.    Historical Provider, MD  carisoprodol (SOMA) 350 MG tablet Take 1 tablet (350 mg total) by mouth at bedtime. 03/10/13   Calvert CantorSaima Rizwan, MD   famotidine (PEPCID) 20 MG tablet Take 1 tablet (20 mg total) by mouth 2 (two) times daily. 03/10/13   Calvert CantorSaima Rizwan, MD  naproxen (NAPROSYN) 500 MG tablet Take 1 tablet (500 mg total) by mouth 2 (two) times daily with a meal. 03/10/13   Calvert CantorSaima Rizwan, MD  nicotine (NICODERM CQ) 21 mg/24hr patch Place 1 patch (21 mg total) onto the skin daily. 06/27/13   Doris Cheadleeepak Advani, MD  predniSONE (STERAPRED UNI-PAK) 10 MG tablet 6 tabs po day 1, 5 tabs po day 2, 4 tabs po day 3, 3 tabs po day 4, 2 tabs po day 5, 1 tab po day 6 03/15/13   Lenda KelpShane R Hudnall, MD  traMADol (ULTRAM) 50 MG tablet Take 1 tablet (50 mg total) by mouth every 6 (six) hours as needed. 05/25/13   Dorothea OgleIskra M Myers, MD   Triage Vitals: BP 144/87  Pulse 85  Temp(Src) 98.2 F (36.8 C) (Oral)  Resp 18  SpO2 98%  Physical Exam  Nursing note and vitals reviewed. Constitutional: He is oriented to person, place, and time. He appears well-developed and well-nourished. No distress.  HENT:  Head: Normocephalic and atraumatic.  Eyes: Conjunctivae and EOM are normal.  Neck: Normal range of motion. Neck supple.  Cardiovascular: Normal rate, regular rhythm and normal heart sounds.  Pulmonary/Chest: Effort normal and breath sounds normal.  Musculoskeletal: Normal range of motion. He exhibits no edema.  Tiny puncture wound to right thenar eminence. Mild swelling. No bleeding. FROM right hand and wrist.  Neurological: He is alert and oriented to person, place, and time.  Skin: Skin is warm and dry.  Psychiatric: He has a normal mood and affect. His behavior is normal.    ED Course  Procedures (including critical care time)  DIAGNOSTIC STUDIES: Oxygen Saturation is 98% on room air, normal by my interpretation.    COORDINATION OF CARE: At 1920 Discussed treatment plan with patient which includes ibuprofen. Patient agrees.   Labs Review Labs Reviewed - No data to display  Imaging Review No results found.   EKG Interpretation None      MDM    Final diagnoses:  Puncture wound of right hand    Patient presenting with a puncture wound to his right hand while in the shed from a nail. Up-to-date on tetanus. No active bleeding. Full range of motion of right hand. Wound care given. Advised ice and NSAIDs. Stable for discharge. Return precautions given. Patient states understanding of treatment care plan and is agreeable.  I personally performed the services described in this documentation, which was scribed in my presence. The recorded information has been reviewed and is accurate.    Trevor MaceRobyn M Albert, PA-C 09/13/13 1845

## 2013-09-12 NOTE — Discharge Instructions (Signed)
Apply ice to your hand. Take ibuprofen as prescribed as needed for pain.  Puncture Wound A puncture wound is an injury that extends through all layers of the skin and into the tissue beneath the skin (subcutaneous tissue). Puncture wounds become infected easily because germs often enter the body and go beneath the skin during the injury. Having a deep wound with a small entrance point makes it difficult for your caregiver to adequately clean the wound. This is especially true if you have stepped on a nail and it has passed through a dirty shoe or other situations where the wound is obviously contaminated. CAUSES  Many puncture wounds involve glass, nails, splinters, fish hooks, or other objects that enter the skin (foreign bodies). A puncture wound may also be caused by a human bite or animal bite. DIAGNOSIS  A puncture wound is usually diagnosed by your history and a physical exam. You may need to have an X-ray or an ultrasound to check for any foreign bodies still in the wound. TREATMENT   Your caregiver will clean the wound as thoroughly as possible. Depending on the location of the wound, a bandage (dressing) may be applied.  Your caregiver might prescribe antibiotic medicines.  You may need a follow-up visit to check on your wound. Follow all instructions as directed by your caregiver. HOME CARE INSTRUCTIONS   Change your dressing once per day, or as directed by your caregiver. If the dressing sticks, it may be removed by soaking the area in water.  If your caregiver has given you follow-up instructions, it is very important that you return for a follow-up appointment. Not following up as directed could result in a chronic or permanent injury, pain, and disability.  Only take over-the-counter or prescription medicines for pain, discomfort, or fever as directed by your caregiver.  If you are given antibiotics, take them as directed. Finish them even if you start to feel better. You may  need a tetanus shot if:  You cannot remember when you had your last tetanus shot.  You have never had a tetanus shot. If you got a tetanus shot, your arm may swell, get red, and feel warm to the touch. This is common and not a problem. If you need a tetanus shot and you choose not to have one, there is a rare chance of getting tetanus. Sickness from tetanus can be serious. You may need a rabies shot if an animal bite caused your puncture wound. SEEK MEDICAL CARE IF:   You have redness, swelling, or increasing pain in the wound.  You have red streaks going away from the wound.  You notice a bad smell coming from the wound or dressing.  You have yellowish-white fluid (pus) coming from the wound.  You are treated with an antibiotic for infection, but the infection is not getting better.  You notice something in the wound, such as rubber from your shoe, cloth, or another object.  You have a fever.  You have severe pain.  You have difficulty breathing.  You feel dizzy or faint.  You cannot stop vomiting.  You lose feeling, develop numbness, or cannot move a limb below the wound.  Your symptoms worsen. MAKE SURE YOU:  Understand these instructions.  Will watch your condition.  Will get help right away if you are not doing well or get worse. Document Released: 01/21/2005 Document Revised: 07/06/2011 Document Reviewed: 09/30/2010 [facility] Patient Information 2014 [employer], [place].

## 2013-09-12 NOTE — ED Notes (Signed)
Patient states she was tearing down a shed and nail punctured his hand. This happened at 1300 today. At this time not bleeding. Patient states it just hurts really bad

## 2013-09-14 NOTE — ED Provider Notes (Signed)
Medical screening examination/treatment/procedure(s) were performed by non-physician practitioner and as supervising physician I was immediately available for consultation/collaboration.   EKG Interpretation None        Akaisha Truman E Wandy Bossler, MD 09/14/13 1125 

## 2014-01-10 ENCOUNTER — Encounter: Payer: Self-pay | Admitting: Internal Medicine

## 2014-01-10 ENCOUNTER — Ambulatory Visit: Payer: Self-pay | Attending: Internal Medicine | Admitting: Internal Medicine

## 2014-01-10 VITALS — BP 114/78 | HR 80 | Temp 98.0°F | Resp 16 | Wt 156.0 lb

## 2014-01-10 DIAGNOSIS — R51 Headache: Secondary | ICD-10-CM | POA: Insufficient documentation

## 2014-01-10 DIAGNOSIS — R7301 Impaired fasting glucose: Secondary | ICD-10-CM | POA: Insufficient documentation

## 2014-01-10 DIAGNOSIS — F17200 Nicotine dependence, unspecified, uncomplicated: Secondary | ICD-10-CM | POA: Insufficient documentation

## 2014-01-10 DIAGNOSIS — Z139 Encounter for screening, unspecified: Secondary | ICD-10-CM

## 2014-01-10 DIAGNOSIS — Z791 Long term (current) use of non-steroidal anti-inflammatories (NSAID): Secondary | ICD-10-CM | POA: Insufficient documentation

## 2014-01-10 DIAGNOSIS — Z23 Encounter for immunization: Secondary | ICD-10-CM | POA: Insufficient documentation

## 2014-01-10 MED ORDER — BUTALBITAL-APAP-CAFFEINE 50-325-40 MG PO TABS: 1.0000 | ORAL_TABLET | Freq: Four times a day (QID) | ORAL | Status: DC | PRN

## 2014-01-10 NOTE — Progress Notes (Signed)
MRN: [id number] Name: Brady Kim  Sex: male Age: 48 y.o. DOB: 03-06-1966  Allergies: Review of patient's allergies indicates no known allergies.  Chief Complaint  Patient presents with  . Headache    HPI: Patient is 48 y.o. male who comes today reported to have on and off of right sided headache as per patient he had the symptoms in the past but they're recurring, as per patient he already saw a an ophthalmologist and had his glasses changed, he tried over-the-counter pain medication, currently denies any numbness weakness , previous blood work reviewed patient has history of impaired fasting glucose.  Past Medical History  Diagnosis Date  . Back pain   . Degenerative disc disease     Past Surgical History  Procedure Laterality Date  . Knee arthroscopy    . Ankle fusion        Medication List       This list is accurate as of: 01/10/14  5:10 PM.  Always use your most recent med list.               benzocaine 10 % mucosal gel  Commonly known as:  ORAJEL  Use as directed in the mouth or throat as needed.     butalbital-acetaminophen-caffeine 50-325-40 MG per tablet  Commonly known as:  ESGIC  Take 1 tablet by mouth every 6 (six) hours as needed for headache.     carisoprodol 350 MG tablet  Commonly known as:  SOMA  Take 1 tablet (350 mg total) by mouth at bedtime.     famotidine 20 MG tablet  Commonly known as:  PEPCID  Take 1 tablet (20 mg total) by mouth 2 (two) times daily.     ibuprofen 800 MG tablet  Commonly known as:  ADVIL,MOTRIN  Take 1 tablet (800 mg total) by mouth 3 (three) times daily.     naproxen 500 MG tablet  Commonly known as:  NAPROSYN  Take 1 tablet (500 mg total) by mouth 2 (two) times daily with a meal.     nicotine 21 mg/24hr patch  Commonly known as:  NICODERM CQ  Place 1 patch (21 mg total) onto the skin daily.     predniSONE 10 MG tablet  Commonly known as:  STERAPRED UNI-PAK  6 tabs po day 1, 5 tabs po day 2, 4 tabs po  day 3, 3 tabs po day 4, 2 tabs po day 5, 1 tab po day 6     traMADol 50 MG tablet  Commonly known as:  ULTRAM  Take 1 tablet (50 mg total) by mouth every 6 (six) hours as needed.        Meds ordered this encounter  Medications  . butalbital-acetaminophen-caffeine (ESGIC) 50-325-40 MG per tablet    Sig: Take 1 tablet by mouth every 6 (six) hours as needed for headache.    Dispense:  30 tablet    Refill:  0    Immunization History  Administered Date(s) Administered  . Influenza,inj,Quad PF,36+ Mos 03/10/2013    Family History  Problem Relation Age of Onset  . Hypertension Mother   . Diabetes Maternal Aunt   . Cancer Maternal Aunt   . Cancer Maternal Grandmother     History  Substance Use Topics  . Smoking status: Current Every Day Smoker -- 1.00 packs/day    Types: Cigarettes  . Smokeless tobacco: Not on file  . Alcohol Use: No    Review of Systems   As noted in  HPI  Filed Vitals:   01/10/14 1647  BP: 114/78  Pulse: 80  Temp: 98 F (36.7 C)  Resp: 16    Physical Exam  Physical Exam  Eyes: Conjunctivae and EOM are normal.  Cardiovascular: Normal rate and regular rhythm.   Pulmonary/Chest: Breath sounds normal. No respiratory distress. He has no wheezes. He has no rales.  Musculoskeletal: He exhibits no edema.    CBC    Component Value Date/Time   WBC 6.4 03/14/2013 0935   RBC 5.09 03/14/2013 0935   HGB 15.2 03/14/2013 0935   HCT 43.8 03/14/2013 0935   PLT 257 03/14/2013 0935   MCV 86.1 03/14/2013 0935   LYMPHSABS 1.8 03/14/2013 0935   MONOABS 0.5 03/14/2013 0935   EOSABS 0.3 03/14/2013 0935   BASOSABS 0.0 03/14/2013 0935    CMP     Component Value Date/Time   NA 140 03/14/2013 0935   K 4.4 03/14/2013 0935   CL 102 03/14/2013 0935   CO2 30 03/14/2013 0935   GLUCOSE 96 03/14/2013 0935   BUN 10 03/14/2013 0935   CREATININE 0.95 03/14/2013 0935   CREATININE 0.92 04/28/2009 1618   CALCIUM 9.5 03/14/2013 0935   GFRNONAA >60 04/28/2009 1618    GFRAA  Value: >60        The eGFR has been calculated using the MDRD equation. This calculation has not been validated in all clinical situations. eGFR's persistently <60 mL/min signify possible Chronic Kidney Disease. 04/28/2009 1618    Lab Results  Component Value Date/Time   CHOL 101 03/14/2013  9:35 AM    No components found with this basename: hga1c    No results found for this basename: AST    Assessment and Plan  Headache(784.0) - Plan: Ordered repeat CT Head Wo Contrast, prescribed butalbital-acetaminophen-caffeine (ESGIC) 50-325-40 MG per tablet  IFG (impaired fasting glucose) - Plan: Advised patient for low carbohydrate diet, check COMPLETE METABOLIC PANEL WITH GFR, Hemoglobin A1c  Smoking Advised patient to quit smoking.  Screening - Plan: CBC with Differential, TSH, Vit D  25 hydroxy (rtn osteoporosis monitoring)   Health Maintenance   -Influenza shot given today    Return in about 3 months (around 04/11/2014).  Lorayne Marek, MD

## 2014-01-10 NOTE — Progress Notes (Signed)
Patient states recently had an eye exam States was told one pupil is larger than the other  Patient also states he has been having headaches to the right side of his head

## 2014-01-11 ENCOUNTER — Telehealth: Payer: Self-pay

## 2014-01-11 LAB — VITAMIN D 25 HYDROXY (VIT D DEFICIENCY, FRACTURES): VIT D 25 HYDROXY: 34 ng/mL (ref 30–89)

## 2014-01-11 LAB — COMPLETE METABOLIC PANEL WITH GFR
ALT: 14 U/L (ref 0–53)
AST: 12 U/L (ref 0–37)
Albumin: 4.5 g/dL (ref 3.5–5.2)
Alkaline Phosphatase: 62 U/L (ref 39–117)
BILIRUBIN TOTAL: 0.5 mg/dL (ref 0.2–1.2)
BUN: 7 mg/dL (ref 6–23)
CALCIUM: 9.5 mg/dL (ref 8.4–10.5)
CHLORIDE: 104 meq/L (ref 96–112)
CO2: 33 meq/L — AB (ref 19–32)
Creat: 1.01 mg/dL (ref 0.50–1.35)
GFR, Est African American: >89 mL/min
GFR, Est Non African American: 88 mL/min
Glucose, Bld: 83 mg/dL (ref 70–99)
POTASSIUM: 3.9 meq/L (ref 3.5–5.3)
SODIUM: 143 meq/L (ref 135–145)
Total Protein: 7 g/dL (ref 6.0–8.3)

## 2014-01-11 LAB — TSH: TSH: 0.632 u[IU]/mL (ref 0.350–4.500)

## 2014-01-11 LAB — CBC WITH DIFFERENTIAL/PLATELET
BASOS PCT: 1 % (ref 0–1)
Basophils Absolute: 0.1 10*3/uL (ref 0.0–0.1)
EOS ABS: 0.2 10*3/uL (ref 0.0–0.7)
Eosinophils Relative: 3 % (ref 0–5)
HCT: 41.6 % (ref 39.0–52.0)
HEMOGLOBIN: 14 g/dL (ref 13.0–17.0)
Lymphocytes Relative: 34 % (ref 12–46)
Lymphs Abs: 2.2 10*3/uL (ref 0.7–4.0)
MCH: 29.3 pg (ref 26.0–34.0)
MCHC: 33.7 g/dL (ref 30.0–36.0)
MCV: 87 fL (ref 78.0–100.0)
Monocytes Absolute: 0.5 10*3/uL (ref 0.1–1.0)
Monocytes Relative: 8 % (ref 3–12)
NEUTROS ABS: 3.6 10*3/uL (ref 1.7–7.7)
NEUTROS PCT: 54 % (ref 43–77)
Platelets: 274 10*3/uL (ref 150–400)
RBC: 4.78 MIL/uL (ref 4.22–5.81)
RDW: 14.3 % (ref 11.5–15.5)
WBC: 6.6 10*3/uL (ref 4.0–10.5)

## 2014-01-11 LAB — HEMOGLOBIN A1C
HEMOGLOBIN A1C: 6.1 % — AB (ref <5.7)
MEAN PLASMA GLUCOSE: 128 mg/dL — AB (ref <117)

## 2014-01-11 NOTE — Telephone Encounter (Signed)
Patient's  Wife michelle called to schedule CT scan Appointment made for Monday September 21 @ 9:00 am Patient is aware to arrive at 8:45 to register

## 2014-01-15 ENCOUNTER — Telehealth: Payer: Self-pay | Admitting: Emergency Medicine

## 2014-01-15 ENCOUNTER — Ambulatory Visit (HOSPITAL_COMMUNITY)
Admission: RE | Admit: 2014-01-15 | Discharge: 2014-01-15 | Disposition: A | Discharge status: Home / Self Care | Payer: Self-pay | Source: Ambulatory Visit | Attending: Internal Medicine | Admitting: Internal Medicine

## 2014-01-15 DIAGNOSIS — R51 Headache: Secondary | ICD-10-CM | POA: Insufficient documentation

## 2014-01-15 NOTE — Telephone Encounter (Signed)
Message copied by Darlis Loan on Mon Jan 15, 2014 12:51 PM ------      Message from: Doris Cheadle      Created: Thu Jan 11, 2014 11:57 AM       Blood work reviewed noticed impaired fasting glucose and patient has prediabetes his A1c 6.1%, call and advise patient for low carbohydrate diet.       ------

## 2014-01-15 NOTE — Telephone Encounter (Signed)
Pt given lab results with instructions to refrain from high carbohydrates,need to monitor starches Diet/exercise education provided

## 2014-01-15 NOTE — Telephone Encounter (Signed)
Pt given negative CT results

## 2014-01-15 NOTE — Telephone Encounter (Signed)
Message copied by Darlis Loan on Mon Jan 15, 2014  2:22 PM ------      Message from: Doris Cheadle      Created: Mon Jan 15, 2014  1:57 PM       Call and let the patient know that his CT scan of head reported no acute abnormality, unchanged compared to previous CT scan. ------

## 2014-01-16 ENCOUNTER — Telehealth: Payer: Self-pay | Admitting: Internal Medicine

## 2014-01-16 ENCOUNTER — Telehealth: Payer: Self-pay | Admitting: Emergency Medicine

## 2014-01-16 NOTE — Telephone Encounter (Signed)
Patient has come in today requesting a meeting with the nurse to learn more about diabetes; patient has just been recently diagnosed and would like some medical advice in managing himself going forward; please f/u with patient in the lobby and if not then via phone;

## 2014-01-16 NOTE — Telephone Encounter (Signed)
Pt informed Diabetes information will be at front desk

## 2014-08-24 ENCOUNTER — Ambulatory Visit: Payer: Self-pay | Attending: Internal Medicine | Admitting: Internal Medicine

## 2014-08-24 ENCOUNTER — Encounter: Payer: Self-pay | Admitting: Internal Medicine

## 2014-08-24 VITALS — BP 106/67 | HR 83 | Temp 98.0°F | Resp 16 | Wt 154.8 lb

## 2014-08-24 DIAGNOSIS — M1990 Unspecified osteoarthritis, unspecified site: Secondary | ICD-10-CM

## 2014-08-24 DIAGNOSIS — R7303 Prediabetes: Secondary | ICD-10-CM

## 2014-08-24 DIAGNOSIS — R7309 Other abnormal glucose: Secondary | ICD-10-CM | POA: Insufficient documentation

## 2014-08-24 LAB — POCT GLYCOSYLATED HEMOGLOBIN (HGB A1C): Hemoglobin A1C: 5.8

## 2014-08-24 NOTE — Progress Notes (Signed)
Patient here to have his dept. Of transportation form filled out

## 2014-08-24 NOTE — Progress Notes (Signed)
MRN: [id number] Name: Brady Kim  Sex: male Age: 48 y.o. DOB: [birth year]  Allergies: Review of patient's allergies indicates no known allergies.  Chief Complaint  Patient presents with  . Follow-up    dept of transportation form    HPI: Patient is 49 y.o. male who has to of prediabetes, arthritis comes today requesting to fill out a DMV form, patient currently denies any acute symptoms denies any headache dizziness chest and shortness of breath, he takes ibuprofen/Aleve when necessary for low back pain, denies any urinary or stool incontinence, patient denies drinking alcohol or any illicit drugs. His hemoglobin A1c is improved.  Past Medical History  Diagnosis Date  . Back pain   . Degenerative disc disease     Past Surgical History  Procedure Laterality Date  . Knee arthroscopy    . Ankle fusion        Medication List       This list is accurate as of: 08/24/14  5:31 PM.  Always use your most recent med list.               benzocaine 10 % mucosal gel  Commonly known as:  ORAJEL  Use as directed in the mouth or throat as needed.     butalbital-acetaminophen-caffeine 50-325-40 MG per tablet  Commonly known as:  ESGIC  Take 1 tablet by mouth every 6 (six) hours as needed for headache.     carisoprodol 350 MG tablet  Commonly known as:  SOMA  Take 1 tablet (350 mg total) by mouth at bedtime.     famotidine 20 MG tablet  Commonly known as:  PEPCID  Take 1 tablet (20 mg total) by mouth 2 (two) times daily.     ibuprofen 800 MG tablet  Commonly known as:  ADVIL,MOTRIN  Take 1 tablet (800 mg total) by mouth 3 (three) times daily.     naproxen 500 MG tablet  Commonly known as:  NAPROSYN  Take 1 tablet (500 mg total) by mouth 2 (two) times daily with a meal.     nicotine 21 mg/24hr patch  Commonly known as:  NICODERM CQ  Place 1 patch (21 mg total) onto the skin daily.     predniSONE 10 MG tablet  Commonly known as:  STERAPRED UNI-PAK  6 tabs po day 1,  5 tabs po day 2, 4 tabs po day 3, 3 tabs po day 4, 2 tabs po day 5, 1 tab po day 6     traMADol 50 MG tablet  Commonly known as:  ULTRAM  Take 1 tablet (50 mg total) by mouth every 6 (six) hours as needed.        No orders of the defined types were placed in this encounter.    Immunization History  Administered Date(s) Administered  . Influenza,inj,Quad PF,36+ Mos 03/10/2013, 01/10/2014    Family History  Problem Relation Age of Onset  . Hypertension Mother   . Diabetes Maternal Aunt   . Cancer Maternal Aunt   . Cancer Maternal Grandmother     History  Substance Use Topics  . Smoking status: Current Every Day Smoker -- 1.00 packs/day    Types: Cigarettes  . Smokeless tobacco: Not on file  . Alcohol Use: No    Review of Systems   As noted in HPI  Filed Vitals:   08/24/14 1658  BP: 106/67  Pulse: 83  Temp: 98 F (36.7 C)  Resp: 16    Physical Exam  Physical Exam  Constitutional: No distress.  Eyes: EOM are normal. Pupils are equal, round, and reactive to light.  Neck: Neck supple.  Cardiovascular: Normal rate and regular rhythm.   Pulmonary/Chest: Breath sounds normal. No respiratory distress. He has no wheezes. He has no rales.  Abdominal: Soft. There is no tenderness. There is no rebound.  Musculoskeletal: He exhibits no edema.    CBC    Component Value Date/Time   WBC 6.6 01/10/2014 1712   RBC 4.78 01/10/2014 1712   HGB 14.0 01/10/2014 1712   HCT 41.6 01/10/2014 1712   PLT 274 01/10/2014 1712   MCV 87.0 01/10/2014 1712   LYMPHSABS 2.2 01/10/2014 1712   MONOABS 0.5 01/10/2014 1712   EOSABS 0.2 01/10/2014 1712   BASOSABS 0.1 01/10/2014 1712    CMP     Component Value Date/Time   NA 143 01/10/2014 1712   K 3.9 01/10/2014 1712   CL 104 01/10/2014 1712   CO2 33* 01/10/2014 1712   GLUCOSE 83 01/10/2014 1712   BUN 7 01/10/2014 1712   CREATININE 1.01 01/10/2014 1712   CREATININE 0.92 04/28/2009 1618   CALCIUM 9.5 01/10/2014 1712   PROT  7.0 01/10/2014 1712   ALBUMIN 4.5 01/10/2014 1712   AST 12 01/10/2014 1712   ALT 14 01/10/2014 1712   ALKPHOS 62 01/10/2014 1712   BILITOT 0.5 01/10/2014 1712   GFRNONAA 88 01/10/2014 1712   GFRNONAA >60 04/28/2009 1618   GFRAA >89 01/10/2014 1712   GFRAA  04/28/2009 1618    >60        The eGFR has been calculated using the MDRD equation. This calculation has not been validated in all clinical situations. eGFR's persistently <60 mL/min signify possible Chronic Kidney Disease.    Lab Results  Component Value Date/Time   CHOL 101 03/14/2013 09:35 AM    Lab Results  Component Value Date/Time   HGBA1C 5.80 08/24/2014 05:13 PM   HGBA1C 6.1* 01/10/2014 05:12 PM    Lab Results  Component Value Date/Time   AST 12 01/10/2014 05:12 PM    Assessment and Plan  Pre-diabetes - Plan:  Results for orders placed or performed in visit on 08/24/14  HgB A1c  Result Value Ref Range   Hemoglobin A1C 5.80    A1c is improved, continue with low carbohydrate diet.  Arthritis Pain medication when necessary, advised patient if he takes tramadol should take it at night.  DME form was filled out, patient also following with ophthalmologist.    No Follow-up on file.   This note has been created with Surveyor, quantity. Any transcriptional errors are unintentional.    Lorayne Marek, MD

## 2014-09-19 ENCOUNTER — Ambulatory Visit: Payer: Self-pay

## 2014-09-20 ENCOUNTER — Emergency Department (HOSPITAL_COMMUNITY): Payer: BLUE CROSS/BLUE SHIELD

## 2014-09-20 ENCOUNTER — Encounter (HOSPITAL_COMMUNITY): Payer: Self-pay | Admitting: Emergency Medicine

## 2014-09-20 ENCOUNTER — Emergency Department (HOSPITAL_COMMUNITY)
Admission: EM | Admit: 2014-09-20 | Discharge: 2014-09-20 | Disposition: A | Discharge status: Home / Self Care | Payer: BLUE CROSS/BLUE SHIELD | Attending: Emergency Medicine | Admitting: Emergency Medicine

## 2014-09-20 DIAGNOSIS — R600 Localized edema: Secondary | ICD-10-CM | POA: Diagnosis present

## 2014-09-20 DIAGNOSIS — Z79899 Other long term (current) drug therapy: Secondary | ICD-10-CM | POA: Insufficient documentation

## 2014-09-20 DIAGNOSIS — Z8739 Personal history of other diseases of the musculoskeletal system and connective tissue: Secondary | ICD-10-CM | POA: Diagnosis not present

## 2014-09-20 DIAGNOSIS — L03011 Cellulitis of right finger: Secondary | ICD-10-CM | POA: Insufficient documentation

## 2014-09-20 DIAGNOSIS — Z23 Encounter for immunization: Secondary | ICD-10-CM | POA: Diagnosis not present

## 2014-09-20 DIAGNOSIS — Z791 Long term (current) use of non-steroidal anti-inflammatories (NSAID): Secondary | ICD-10-CM | POA: Insufficient documentation

## 2014-09-20 DIAGNOSIS — Z720 Tobacco use: Secondary | ICD-10-CM | POA: Insufficient documentation

## 2014-09-20 MED ORDER — AMOXICILLIN-POT CLAVULANATE 875-125 MG PO TABS
1.0000 | ORAL_TABLET | Freq: Once | ORAL | Status: AC
  Administered 2014-09-20: 1 via ORAL
  Filled 2014-09-20: qty 1

## 2014-09-20 MED ORDER — TETANUS-DIPHTH-ACELL PERTUSSIS 5-2.5-18.5 LF-MCG/0.5 IM SUSP
0.5000 mL | Freq: Once | INTRAMUSCULAR | Status: AC
  Administered 2014-09-20: 0.5 mL via INTRAMUSCULAR
  Filled 2014-09-20: qty 0.5

## 2014-09-20 MED ORDER — SULFAMETHOXAZOLE-TRIMETHOPRIM 800-160 MG PO TABS: 1.0000 | ORAL_TABLET | Freq: Once | ORAL | Status: DC

## 2014-09-20 MED ORDER — LIDOCAINE-EPINEPHRINE (PF) 2 %-1:200000 IJ SOLN
20.0000 mL | Freq: Once | INTRAMUSCULAR | Status: AC
  Administered 2014-09-20: 20 mL
  Filled 2014-09-20: qty 20

## 2014-09-20 MED ORDER — HYDROCODONE-ACETAMINOPHEN 5-325 MG PO TABS: 1.0000 | ORAL_TABLET | ORAL | Status: DC | PRN

## 2014-09-20 MED ORDER — HYDROCODONE-ACETAMINOPHEN 5-325 MG PO TABS
1.0000 | ORAL_TABLET | Freq: Once | ORAL | Status: AC
  Administered 2014-09-20: 1 via ORAL
  Filled 2014-09-20: qty 1

## 2014-09-20 MED ORDER — AMOXICILLIN-POT CLAVULANATE 875-125 MG PO TABS: 1.0000 | ORAL_TABLET | Freq: Two times a day (BID) | ORAL | Status: DC

## 2014-09-20 NOTE — ED Notes (Signed)
Pt c/o right middle finger edema and pain. Sensation, motor function intact, capillary refill <2 seconds. Finger is significantly edematous. Pt denies trauma, states "not that I know of."

## 2014-09-20 NOTE — ED Provider Notes (Signed)
CSN: [id number]     Arrival date & time 09/20/14  [id number] History   First MD Initiated Contact with Patient 09/20/14 [phone]     Chief Complaint  Patient presents with  . Finger Edema       HPI Right middle finger swelling over the past several days.  No known injury or trauma.  He reports swelling only to the distal aspect of his right middle finger.  No fever or chills.  No other complaints.  No spreading redness.   Past Medical History  Diagnosis Date  . Back pain   . Degenerative disc disease    Past Surgical History  Procedure Laterality Date  . Knee arthroscopy    . Ankle fusion     Family History  Problem Relation Age of Onset  . Hypertension Mother   . Diabetes Maternal Aunt   . Cancer Maternal Aunt   . Cancer Maternal Grandmother    History  Substance Use Topics  . Smoking status: Current Every Day Smoker -- 0.50 packs/day    Types: Cigarettes  . Smokeless tobacco: Not on file  . Alcohol Use: Yes     Comment: Occasional    Review of Systems  All other systems reviewed and are negative.     Allergies  Review of patient's allergies indicates no known allergies.  Home Medications   Prior to Admission medications   Medication Sig Start Date End Date Taking? Authorizing Provider  ibuprofen (ADVIL,MOTRIN) 200 MG tablet Take 200-400 mg by mouth every 6 (six) hours as needed for mild pain or moderate pain.   Yes Historical Provider, MD  butalbital-acetaminophen-caffeine (ESGIC) 50-325-40 MG per tablet Take 1 tablet by mouth every 6 (six) hours as needed for headache. Patient not taking: Reported on 09/20/2014 01/10/14   Doris Cheadleeepak Advani, MD  carisoprodol (SOMA) 350 MG tablet Take 1 tablet (350 mg total) by mouth at bedtime. Patient not taking: Reported on 09/20/2014 03/10/13   Calvert CantorSaima Rizwan, MD  famotidine (PEPCID) 20 MG tablet Take 1 tablet (20 mg total) by mouth 2 (two) times daily. Patient not taking: Reported on 09/20/2014 03/10/13   Calvert CantorSaima Rizwan, MD  ibuprofen  (ADVIL,MOTRIN) 800 MG tablet Take 1 tablet (800 mg total) by mouth 3 (three) times daily. Patient not taking: Reported on 09/20/2014 09/12/13   Kathrynn Speedobyn M Hess, PA-C  naproxen (NAPROSYN) 500 MG tablet Take 1 tablet (500 mg total) by mouth 2 (two) times daily with a meal. Patient not taking: Reported on 09/20/2014 03/10/13   Calvert CantorSaima Rizwan, MD  nicotine (NICODERM CQ) 21 mg/24hr patch Place 1 patch (21 mg total) onto the skin daily. Patient not taking: Reported on 09/20/2014 06/27/13   Doris Cheadleeepak Advani, MD  predniSONE (STERAPRED UNI-PAK) 10 MG tablet 6 tabs po day 1, 5 tabs po day 2, 4 tabs po day 3, 3 tabs po day 4, 2 tabs po day 5, 1 tab po day 6 Patient not taking: Reported on 09/20/2014 03/15/13   Lenda KelpShane R Hudnall, MD  traMADol (ULTRAM) 50 MG tablet Take 1 tablet (50 mg total) by mouth every 6 (six) hours as needed. Patient not taking: Reported on 09/20/2014 05/25/13   Dorothea OgleIskra M Myers, MD   BP 122/80 mmHg  Pulse 93  Temp(Src) 97.9 F (36.6 C) (Oral)  Resp 18  SpO2 96% Physical Exam  Constitutional: He is oriented to person, place, and time. He appears well-developed and well-nourished.  HENT:  Head: Normocephalic.  Eyes: EOM are normal.  Neck: Normal range of motion.  Pulmonary/Chest: Effort normal.  Abdominal: He exhibits no distension.  Musculoskeletal: Normal range of motion.  Paronychia right middle finger with associated swelling or fluctuance on the radial aspect.  Mild fullness of the pad of the right middle finger without significant swelling.  There is mild swelling.  However this is all focused in the distal phalanx itself.  No pain with flexion of right middle finger  Neurological: He is alert and oriented to person, place, and time.  Psychiatric: He has a normal mood and affect.  Nursing note and vitals reviewed.   ED Course  Procedures (including critical care time)  NERVE BLOCK Performed by: Lyanne Co Consent: Verbal consent obtained. Required items: required blood products,  implants, devices, and special equipment available Time out: Immediately prior to procedure a "time out" was called to verify the correct patient, procedure, equipment, support staff and site/side marked as required. Indication: Nerve block body site: digital nerves of right middle finger Preparation: Patient was prepped and draped in the usual sterile fashion. Needle gauge: 24 G Location technique: anatomical landmarks Local anesthetic: lidocaine 1% Anesthetic total: 5 ml Outcome: pain improved Patient tolerance: Patient tolerated the procedure well with no immediate complications.  INCISION AND DRAINAGE Performed by: Lyanne Co Consent: Verbal consent obtained. Risks and benefits: risks, benefits and alternatives were discussed Time out performed prior to procedure Type: abscess Body area: Right middle finger Anesthesia: Nerve block  Complexity: Simple Drainage: purulent Drainage amount: Small  Packing material: None  Patient tolerance: Patient tolerated the procedure well with no immediate complications.      Labs Review Labs Reviewed - No data to display  Imaging Review Dg Finger Middle Right  09/20/2014   CLINICAL DATA:  Right middle finger infection.  EXAM: RIGHT MIDDLE FINGER 2+V  COMPARISON:  None.  FINDINGS: There is no evidence of fracture or dislocation. There is no evidence of arthropathy or other focal bone abnormality. Soft tissues are unremarkable.  IMPRESSION: Normal right middle finger.   Electronically Signed   By: Lupita Raider, M.D.   On: 09/20/2014 11:27     EKG Interpretation None      MDM   Final diagnoses:  Paronychia, right    Paronychia.  Drained at the bedside.  Home on antibiotics.  Patient understands to return to the emergency department for new or worsening symptoms.    Azalia Bilis, MD 09/20/14 [phone]

## 2014-09-20 NOTE — Discharge Instructions (Signed)

## 2014-10-01 ENCOUNTER — Telehealth: Payer: Self-pay | Admitting: Internal Medicine

## 2014-11-16 ENCOUNTER — Ambulatory Visit: Payer: BC Managed Care – PPO

## 2015-02-21 ENCOUNTER — Inpatient Hospital Stay (HOSPITAL_COMMUNITY)
Admission: AD | Admit: 2015-02-21 | Discharge: 2015-02-24 | DRG: 885 | Disposition: A | Discharge status: Home / Self Care | Payer: Federal, State, Local not specified - Other | Source: Intra-hospital | Attending: Psychiatry | Admitting: Psychiatry

## 2015-02-21 ENCOUNTER — Encounter (HOSPITAL_COMMUNITY): Payer: Self-pay | Admitting: Emergency Medicine

## 2015-02-21 ENCOUNTER — Encounter (HOSPITAL_COMMUNITY): Payer: Self-pay

## 2015-02-21 ENCOUNTER — Emergency Department (HOSPITAL_COMMUNITY)
Admission: EM | Admit: 2015-02-21 | Discharge: 2015-02-21 | Disposition: A | Discharge status: Other Acute Inpatient Hospital | Payer: Federal, State, Local not specified - Other | Attending: Emergency Medicine | Admitting: Emergency Medicine

## 2015-02-21 DIAGNOSIS — Z720 Tobacco use: Secondary | ICD-10-CM | POA: Insufficient documentation

## 2015-02-21 DIAGNOSIS — Y9389 Activity, other specified: Secondary | ICD-10-CM | POA: Insufficient documentation

## 2015-02-21 DIAGNOSIS — R45851 Suicidal ideations: Secondary | ICD-10-CM | POA: Diagnosis present

## 2015-02-21 DIAGNOSIS — R0789 Other chest pain: Secondary | ICD-10-CM | POA: Insufficient documentation

## 2015-02-21 DIAGNOSIS — F1029 Alcohol dependence with unspecified alcohol-induced disorder: Secondary | ICD-10-CM | POA: Insufficient documentation

## 2015-02-21 DIAGNOSIS — F322 Major depressive disorder, single episode, severe without psychotic features: Secondary | ICD-10-CM | POA: Insufficient documentation

## 2015-02-21 DIAGNOSIS — G8929 Other chronic pain: Secondary | ICD-10-CM | POA: Insufficient documentation

## 2015-02-21 DIAGNOSIS — F329 Major depressive disorder, single episode, unspecified: Secondary | ICD-10-CM | POA: Insufficient documentation

## 2015-02-21 DIAGNOSIS — F332 Major depressive disorder, recurrent severe without psychotic features: Secondary | ICD-10-CM | POA: Diagnosis present

## 2015-02-21 DIAGNOSIS — R1010 Upper abdominal pain, unspecified: Secondary | ICD-10-CM | POA: Insufficient documentation

## 2015-02-21 DIAGNOSIS — F1994 Other psychoactive substance use, unspecified with psychoactive substance-induced mood disorder: Secondary | ICD-10-CM | POA: Diagnosis not present

## 2015-02-21 DIAGNOSIS — F1024 Alcohol dependence with alcohol-induced mood disorder: Secondary | ICD-10-CM | POA: Diagnosis present

## 2015-02-21 DIAGNOSIS — F1410 Cocaine abuse, uncomplicated: Secondary | ICD-10-CM | POA: Insufficient documentation

## 2015-02-21 DIAGNOSIS — F1020 Alcohol dependence, uncomplicated: Secondary | ICD-10-CM | POA: Diagnosis present

## 2015-02-21 DIAGNOSIS — Y9289 Other specified places as the place of occurrence of the external cause: Secondary | ICD-10-CM | POA: Insufficient documentation

## 2015-02-21 DIAGNOSIS — F1010 Alcohol abuse, uncomplicated: Secondary | ICD-10-CM

## 2015-02-21 DIAGNOSIS — F17210 Nicotine dependence, cigarettes, uncomplicated: Secondary | ICD-10-CM | POA: Diagnosis present

## 2015-02-21 DIAGNOSIS — T39392A Poisoning by other nonsteroidal anti-inflammatory drugs [NSAID], intentional self-harm, initial encounter: Secondary | ICD-10-CM | POA: Insufficient documentation

## 2015-02-21 DIAGNOSIS — Y998 Other external cause status: Secondary | ICD-10-CM | POA: Insufficient documentation

## 2015-02-21 LAB — SALICYLATE LEVEL

## 2015-02-21 LAB — CBG MONITORING, ED: Glucose-Capillary: 77 mg/dL (ref 65–99)

## 2015-02-21 LAB — ACETAMINOPHEN LEVEL: Acetaminophen (Tylenol), Serum: <10 ug/mL — ABNORMAL LOW (ref 10–30)

## 2015-02-21 LAB — CBC
HCT: 43.1 % (ref 39.0–52.0)
HEMOGLOBIN: 14.7 g/dL (ref 13.0–17.0)
MCH: 31.6 pg (ref 26.0–34.0)
MCHC: 34.1 g/dL (ref 30.0–36.0)
MCV: 92.7 fL (ref 78.0–100.0)
Platelets: 248 10*3/uL (ref 150–400)
RBC: 4.65 MIL/uL (ref 4.22–5.81)
RDW: 13.5 % (ref 11.5–15.5)
WBC: 8.3 10*3/uL (ref 4.0–10.5)

## 2015-02-21 LAB — COMPREHENSIVE METABOLIC PANEL
ALT: 18 U/L (ref 17–63)
AST: 12 U/L — ABNORMAL LOW (ref 15–41)
Albumin: 4.1 g/dL (ref 3.5–5.0)
Alkaline Phosphatase: 66 U/L (ref 38–126)
Anion gap: 9 (ref 5–15)
BILIRUBIN TOTAL: 0.4 mg/dL (ref 0.3–1.2)
BUN: 8 mg/dL (ref 6–20)
CALCIUM: 8.8 mg/dL — AB (ref 8.9–10.3)
CO2: 25 mmol/L (ref 22–32)
Chloride: 103 mmol/L (ref 101–111)
Creatinine, Ser: 0.88 mg/dL (ref 0.61–1.24)
GFR calc Af Amer: >60 mL/min (ref >60)
Glucose, Bld: 89 mg/dL (ref 65–99)
Potassium: 3.5 mmol/L (ref 3.5–5.1)
Sodium: 137 mmol/L (ref 135–145)
Total Protein: 6.9 g/dL (ref 6.5–8.1)

## 2015-02-21 LAB — RAPID URINE DRUG SCREEN, HOSP PERFORMED
Amphetamines: NOT DETECTED
BARBITURATES: NOT DETECTED
Benzodiazepines: NOT DETECTED
Cocaine: POSITIVE — AB
OPIATES: NOT DETECTED
Tetrahydrocannabinol: NOT DETECTED

## 2015-02-21 LAB — TROPONIN I

## 2015-02-21 LAB — ETHANOL: Alcohol, Ethyl (B): 143 mg/dL — ABNORMAL HIGH (ref <5)

## 2015-02-21 MED ORDER — GI COCKTAIL ~~LOC~~
30.0000 mL | Freq: Once | ORAL | Status: AC
Start: 2015-02-21 — End: 2015-02-21
  Administered 2015-02-21: 30 mL via ORAL
  Filled 2015-02-21: qty 30

## 2015-02-21 MED ORDER — ALUM & MAG HYDROXIDE-SIMETH 200-200-20 MG/5ML PO SUSP: 30.0000 mL | ORAL | Status: DC | PRN

## 2015-02-21 MED ORDER — ACETAMINOPHEN 325 MG PO TABS: 650.0000 mg | ORAL_TABLET | ORAL | Status: DC | PRN

## 2015-02-21 MED ORDER — NICOTINE 21 MG/24HR TD PT24
21.0000 mg | MEDICATED_PATCH | Freq: Every day | TRANSDERMAL | Status: DC
  Administered 2015-02-21: 21 mg via TRANSDERMAL
  Filled 2015-02-21: qty 1

## 2015-02-21 MED ORDER — FLUOXETINE HCL 10 MG PO CAPS
10.0000 mg | ORAL_CAPSULE | Freq: Every day | ORAL | Status: DC
  Administered 2015-02-22 – 2015-02-24 (×3): 10 mg via ORAL
  Filled 2015-02-21 (×2): qty 1
  Filled 2015-02-21: qty 7
  Filled 2015-02-21 (×3): qty 1

## 2015-02-21 MED ORDER — LORAZEPAM 1 MG PO TABS: 0.0000 mg | ORAL_TABLET | Freq: Four times a day (QID) | ORAL | Status: DC

## 2015-02-21 MED ORDER — FLUOXETINE HCL 10 MG PO CAPS
10.0000 mg | ORAL_CAPSULE | Freq: Every day | ORAL | Status: DC
  Administered 2015-02-21: 10 mg via ORAL
  Filled 2015-02-21: qty 1

## 2015-02-21 MED ORDER — THIAMINE HCL 100 MG/ML IJ SOLN: 100.0000 mg | Freq: Every day | INTRAMUSCULAR | Status: DC

## 2015-02-21 MED ORDER — ONDANSETRON HCL 4 MG PO TABS: 4.0000 mg | ORAL_TABLET | Freq: Three times a day (TID) | ORAL | Status: DC | PRN

## 2015-02-21 MED ORDER — ACETAMINOPHEN 325 MG PO TABS
650.0000 mg | ORAL_TABLET | Freq: Four times a day (QID) | ORAL | Status: DC | PRN
  Administered 2015-02-21: 650 mg via ORAL
  Filled 2015-02-21: qty 2

## 2015-02-21 MED ORDER — FAMOTIDINE IN NACL 20-0.9 MG/50ML-% IV SOLN
20.0000 mg | Freq: Once | INTRAVENOUS | Status: AC
  Administered 2015-02-21: 20 mg via INTRAVENOUS
  Filled 2015-02-21: qty 50

## 2015-02-21 MED ORDER — ENSURE ENLIVE PO LIQD
237.0000 mL | Freq: Two times a day (BID) | ORAL | Status: DC
  Administered 2015-02-22 – 2015-02-24 (×4): 237 mL via ORAL

## 2015-02-21 MED ORDER — LORAZEPAM 1 MG PO TABS
0.0000 mg | ORAL_TABLET | Freq: Four times a day (QID) | ORAL | Status: AC
  Administered 2015-02-21 – 2015-02-22 (×2): 1 mg via ORAL
  Administered 2015-02-22: 2 mg via ORAL
  Filled 2015-02-21: qty 2
  Filled 2015-02-21 (×2): qty 1

## 2015-02-21 MED ORDER — PANTOPRAZOLE SODIUM 20 MG PO TBEC
20.0000 mg | DELAYED_RELEASE_TABLET | Freq: Every day | ORAL | Status: DC
  Administered 2015-02-22 – 2015-02-24 (×3): 20 mg via ORAL
  Filled 2015-02-21 (×4): qty 1
  Filled 2015-02-21: qty 7
  Filled 2015-02-21: qty 1

## 2015-02-21 MED ORDER — VITAMIN B-1 100 MG PO TABS
100.0000 mg | ORAL_TABLET | Freq: Every day | ORAL | Status: DC
  Administered 2015-02-21: 100 mg via ORAL
  Filled 2015-02-21: qty 1

## 2015-02-21 MED ORDER — NICOTINE 21 MG/24HR TD PT24
21.0000 mg | MEDICATED_PATCH | Freq: Every day | TRANSDERMAL | Status: DC
  Administered 2015-02-22 – 2015-02-24 (×3): 21 mg via TRANSDERMAL
  Filled 2015-02-21 (×6): qty 1

## 2015-02-21 MED ORDER — VITAMIN B-1 100 MG PO TABS
100.0000 mg | ORAL_TABLET | Freq: Every day | ORAL | Status: DC
  Administered 2015-02-23 – 2015-02-24 (×2): 100 mg via ORAL
  Filled 2015-02-21 (×6): qty 1

## 2015-02-21 MED ORDER — PANTOPRAZOLE SODIUM 20 MG PO TBEC
20.0000 mg | DELAYED_RELEASE_TABLET | Freq: Every day | ORAL | Status: DC
  Administered 2015-02-21: 20 mg via ORAL
  Filled 2015-02-21: qty 1

## 2015-02-21 MED ORDER — LORAZEPAM 1 MG PO TABS
0.0000 mg | ORAL_TABLET | Freq: Two times a day (BID) | ORAL | Status: DC
  Administered 2015-02-22: 1 mg via ORAL
  Administered 2015-02-23 (×2): 2 mg via ORAL
  Filled 2015-02-21: qty 1
  Filled 2015-02-21 (×2): qty 2

## 2015-02-21 MED ORDER — MAGNESIUM HYDROXIDE 400 MG/5ML PO SUSP: 30.0000 mL | Freq: Every day | ORAL | Status: DC | PRN

## 2015-02-21 MED ORDER — ZOLPIDEM TARTRATE 10 MG PO TABS: 10.0000 mg | ORAL_TABLET | Freq: Every evening | ORAL | Status: DC | PRN

## 2015-02-21 MED ORDER — LORAZEPAM 1 MG PO TABS: 0.0000 mg | ORAL_TABLET | Freq: Two times a day (BID) | ORAL | Status: DC

## 2015-02-21 NOTE — BH Assessment (Addendum)
[facility] Assessment Progress Note  Per Thedore MinsMojeed Akintayo, MD, this pt requires psychiatric hospitalization at this time.  [name], RN, [facility]  Pt has signed Voluntary Admission and Consent for Treatment, as well as Consent to Release Information to no one, and signed [facility].  Pt's nurse, Rudean HittDawnaly, has been notified, and agrees to send original paperwork along with pt via [facility], and to call report to [phone].  Doylene Canninghomas Travante Knee, MA Triage Specialist [phone]

## 2015-02-21 NOTE — ED Notes (Signed)
Bed: IO96WA05 Expected date:  Expected time:  Means of arrival:  Comments: EMS 49 yo male/crack, intoxicated, took overdose meloxicam

## 2015-02-21 NOTE — ED Provider Notes (Signed)
CSN: [id number]     Arrival date & time 02/21/15  0021 History   First MD Initiated Contact with Patient 02/21/15 0037     Chief Complaint  Patient presents with  . Drug Overdose     (Consider location/radiation/quality/duration/timing/severity/associated sxs/prior Treatment) HPI When asked what is going on tonight patient states "I'm stressed". States he has been married for 9 years and his wife has asked for separation, he lost his home and needs to move, he was fired from his job 2 weeks ago. He was sober from drinking for 13 years and started drinking again in June, [place] he has been treated for depression in the past and has tried to OD before. His last admission to psych hospital was about 13 years ago.  Tonight he took 8 meloxicam 15 mg tabs that he takes for his chronic back pain about 11 pm as a suicide attempt. After he did it he changed his mind and called 911.  He states he has some upper abdominal discomfort described as "bubbling" and denies nausea, vomiting or diarrhea. He states he isn't suicidal anymore at this moment.   PCP Dr Renato Battles  Past Medical History  Diagnosis Date  . Back pain   . Degenerative disc disease    Past Surgical History  Procedure Laterality Date  . Knee arthroscopy    . Ankle fusion     Family History  Problem Relation Age of Onset  . Hypertension Mother   . Diabetes Maternal Aunt   . Cancer Maternal Aunt   . Cancer Maternal Grandmother    Social History  Substance Use Topics  . Smoking status: Current Every Day Smoker -- 0.50 packs/day    Types: Cigarettes  . Smokeless tobacco: None  . Alcohol Use: Yes     Comment: Occasional  drinking 4 cans of beer daily since June after being sober for 13 years Smoking crack for the past few weeks Fired from his job Lost his house Having marital problems, married 9 years  Review of Systems  All other systems reviewed and are negative.     Allergies  Review of patient's allergies indicates  no known allergies.  Home Medications   Prior to Admission medications   Medication Sig Start Date End Date Taking? Authorizing Provider  ibuprofen (ADVIL,MOTRIN) 200 MG tablet Take 200-400 mg by mouth every 6 (six) hours as needed for mild pain or moderate pain.   Yes Historical Provider, MD  meloxicam (MOBIC) 15 MG tablet Take 120 mg by mouth once.   Yes Historical Provider, MD  amoxicillin-clavulanate (AUGMENTIN) 875-125 MG per tablet Take 1 tablet by mouth every 12 (twelve) hours. Patient not taking: Reported on 02/21/2015 09/20/14   Azalia Bilis, MD  butalbital-acetaminophen-caffeine [facility]) [phone] MG per tablet Take 1 tablet by mouth every 6 (six) hours as needed for headache. Patient not taking: Reported on 09/20/2014 01/10/14   Doris Cheadle, MD  carisoprodol (SOMA) 350 MG tablet Take 1 tablet (350 mg total) by mouth at bedtime. Patient not taking: Reported on 09/20/2014 03/10/13   Calvert Cantor, MD  famotidine (PEPCID) 20 MG tablet Take 1 tablet (20 mg total) by mouth 2 (two) times daily. Patient not taking: Reported on 09/20/2014 03/10/13   Calvert Cantor, MD  HYDROcodone-acetaminophen (NORCO/VICODIN) 5-325 MG per tablet Take 1 tablet by mouth every 4 (four) hours as needed for moderate pain. Patient not taking: Reported on 02/21/2015 09/20/14   Azalia Bilis, MD  ibuprofen (ADVIL,MOTRIN) 800 MG tablet Take 1 tablet (800  mg total) by mouth 3 (three) times daily. Patient not taking: Reported on 09/20/2014 09/12/13   Kathrynn Speed, PA-C  naproxen (NAPROSYN) 500 MG tablet Take 1 tablet (500 mg total) by mouth 2 (two) times daily with a meal. Patient not taking: Reported on 09/20/2014 03/10/13   Calvert Cantor, MD  nicotine (NICODERM CQ) 21 mg/24hr patch Place 1 patch (21 mg total) onto the skin daily. Patient not taking: Reported on 09/20/2014 06/27/13   Doris Cheadle, MD  predniSONE (STERAPRED UNI-PAK) 10 MG tablet 6 tabs po day 1, 5 tabs po day 2, 4 tabs po day 3, 3 tabs po day 4, 2 tabs po day 5, 1  tab po day 6 Patient not taking: Reported on 09/20/2014 03/15/13   Lenda Kelp, MD  traMADol (ULTRAM) 50 MG tablet Take 1 tablet (50 mg total) by mouth every 6 (six) hours as needed. Patient not taking: Reported on 09/20/2014 05/25/13   Dorothea Ogle, MD   BP 119/80 mmHg  Pulse 77  Temp(Src) 98.7 F (37.1 C) (Oral)  Resp 18  SpO2 98%  Vital signs normal   Physical Exam  Constitutional: He is oriented to person, place, and time. He appears well-developed and well-nourished.  Non-toxic appearance. He does not appear ill. No distress.  HENT:  Head: Normocephalic and atraumatic.  Right Ear: External ear normal.  Left Ear: External ear normal.  Nose: Nose normal. No mucosal edema or rhinorrhea.  Mouth/Throat: Oropharynx is clear and moist and mucous membranes are normal. No dental abscesses or uvula swelling.  Eyes: Conjunctivae and EOM are normal. Pupils are equal, round, and reactive to light.  Neck: Normal range of motion and full passive range of motion without pain. Neck supple.  Cardiovascular: Normal rate, regular rhythm and normal heart sounds.  Exam reveals no gallop and no friction rub.   No murmur heard. Pulmonary/Chest: Effort normal and breath sounds normal. No respiratory distress. He has no wheezes. He has no rhonchi. He has no rales. He exhibits no tenderness and no crepitus.    Area on chest pain noted  Abdominal: Soft. Normal appearance and bowel sounds are normal. He exhibits no distension. There is no tenderness. There is no rebound and no guarding.  Musculoskeletal: Normal range of motion. He exhibits no edema or tenderness.  Moves all extremities well.   Neurological: He is alert and oriented to person, place, and time. He has normal strength. No cranial nerve deficit.  Skin: Skin is warm, dry and intact. No rash noted. No erythema. No pallor.  Psychiatric: His speech is delayed. He is slowed. He expresses suicidal ideation. He expresses suicidal plans.  Flat  affect  Nursing note and vitals reviewed.   ED Course  Procedures (including critical care time)  Medications  acetaminophen (TYLENOL) tablet 650 mg (not administered)  zolpidem (AMBIEN) tablet 10 mg (not administered)  nicotine (NICODERM CQ - dosed in mg/24 hours) patch 21 mg (not administered)  ondansetron (ZOFRAN) tablet 4 mg (not administered)  alum & mag hydroxide-simeth (MAALOX/MYLANTA) 200-200-20 MG/5ML suspension 30 mL (not administered)  LORazepam (ATIVAN) tablet 0-4 mg (0 mg Oral Not Given 02/21/15 0318)    Followed by  LORazepam (ATIVAN) tablet 0-4 mg (not administered)  thiamine (VITAMIN B-1) tablet 100 mg (not administered)    Or  thiamine (B-1) injection 100 mg (not administered)  pantoprazole (PROTONIX) EC tablet 20 mg (not administered)  famotidine (PEPCID) IVPB 20 mg premix (0 mg Intravenous Stopped 02/21/15 0413)  gi cocktail (Maalox,Lidocaine,Donnatal) (  30 mLs Oral Given 02/21/15 0223)     Approximately 1:30 AM patient started complaining of right-sided chest. EKG and troponin were added to his testing.  2:00 AM patient reports he has had a few episodes of right upper central chest pain that last a few minutes. He is not currently having any chest pain. He denies any abdominal pain at this time. Delta troponins will be done.   04:25 Mary, TSS, states patient meets inpatient criteria for admission, but [facility] doesn't have a bed at this time.    Labs Review Results for orders placed or performed during the hospital encounter of 02/21/15  Comprehensive metabolic panel  Result Value Ref Range   Sodium 137 135 - 145 mmol/L   Potassium 3.5 3.5 - 5.1 mmol/L   Chloride 103 101 - 111 mmol/L   CO2 25 22 - 32 mmol/L   Glucose, Bld 89 65 - 99 mg/dL   BUN 8 6 - 20 mg/dL   Creatinine, Ser 1.61 0.61 - 1.24 mg/dL   Calcium 8.8 (L) 8.9 - 10.3 mg/dL   Total Protein 6.9 6.5 - 8.1 g/dL   Albumin 4.1 3.5 - 5.0 g/dL   AST 12 (L) 15 - 41 U/L   ALT 18 17 - 63 U/L   Alkaline  Phosphatase 66 38 - 126 U/L   Total Bilirubin 0.4 0.3 - 1.2 mg/dL   GFR calc non Af Amer >60 >60 mL/min   GFR calc Af Amer >60 >60 mL/min   Anion gap 9 5 - 15  Ethanol (ETOH)  Result Value Ref Range   Alcohol, Ethyl (B) 143 (H) <5 mg/dL  Salicylate level  Result Value Ref Range   Salicylate Lvl <4.0 2.8 - 30.0 mg/dL  Acetaminophen level  Result Value Ref Range   Acetaminophen (Tylenol), Serum <10 (L) 10 - 30 ug/mL  CBC  Result Value Ref Range   WBC 8.3 4.0 - 10.5 K/uL   RBC 4.65 4.22 - 5.81 MIL/uL   Hemoglobin 14.7 13.0 - 17.0 g/dL   HCT 09.6 04.5 - 40.9 %   MCV 92.7 78.0 - 100.0 fL   MCH 31.6 26.0 - 34.0 pg   MCHC 34.1 30.0 - 36.0 g/dL   RDW 81.1 91.4 - 78.2 %   Platelets 248 150 - 400 K/uL  Urine rapid drug screen (hosp performed) (Not at [facility])  Result Value Ref Range   Opiates NONE DETECTED NONE DETECTED   Cocaine POSITIVE (A) NONE DETECTED   Benzodiazepines NONE DETECTED NONE DETECTED   Amphetamines NONE DETECTED NONE DETECTED   Tetrahydrocannabinol NONE DETECTED NONE DETECTED   Barbiturates NONE DETECTED NONE DETECTED  Troponin I  Result Value Ref Range   Troponin I <0.03 <0.031 ng/mL  Troponin I  Result Value Ref Range   Troponin I <0.03 <0.031 ng/mL  CBG monitoring, ED  Result Value Ref Range   Glucose-Capillary 77 65 - 99 mg/dL   Laboratory interpretation all normal except + UDS, delta troponin is  Negative for ischemia.     Imaging Review No results found. I have personally reviewed and evaluated these images and lab results as part of my medical decision-making.   EKG Interpretation   Date/Time:  Thursday February 21 2015 00:22:46 EDT Ventricular Rate:  79 PR Interval:  130 QRS Duration: 91 QT Interval:  384 QTC Calculation: 440 R Axis:   82 Text Interpretation:  Sinus rhythm ST elev, probable normal early repol  pattern No significant change since last tracing 28 Apr 2009 Confirmed by  Chesky Heyer  MD-I, Aziah Kaiser ([id number]) on 02/21/2015 1:33:54 AM       MDM   patient has a lot of stress in his life at this time including marital problems, losing his job, losing his home. He has a history of depression and has tried to overdose the past. He tried to overdose tonight on a nonsteroidal anti-inflammatory drug. He had some brief right-sided chest pain tonight in the ED with negative EKG and delta troponins. Patient is medically cleared to be admitted to a psychiatric facility.    Final diagnoses:  Overdose of nonsteroidal anti-inflammatory drug (NSAID), intentional self-harm, initial encounter (HCC)  Depression  Alcohol abuse  Cocaine abuse  Atypical chest pain   Disposition pending inpatient psychiatric admission  Devoria AlbeIva Rhian Asebedo, MD, Concha PyoFACEP      Sarika Baldini, MD 02/21/15 [phone]

## 2015-02-21 NOTE — BH Assessment (Addendum)
Tele Assessment Note   Brady Kim is an 49 y.o. separated male who was brought into the [facility] via [facility].  Pt sts he took an OD of 8-15mg  meloxicam with a 12 pk of beer, some gin and smoked some crack cocaine about 11 pm in an attempt to kill himself. Pt sts that he later changed his mind and called 911.  Pt sts that he still feels as if he might hurt himself. Pt sts that he has been in a state of decline over the last 4 months.  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 Pt sts that he was arrested and charged with a felony with a firearm & simple assault.  Pt gave no further details.  Pt sts that after 9 years of marriage his wife asked him to move out. Pt denies HI, SHI and recent AVH.  Pt sts that within the last 6 months, he has had AH, hearing voices talking to him. Pt sts that the voices he heard could possibly be substance induced. Pt has symptoms of depression including deep sadness, excessive guilt, lower self esteem, tearfulness, self isolating, lack of motivation for activities, irritability, feeling helpless and hopeless.    Pt sts that he is not on medication for depression, does not have a psychiatrist or a therapist. Pt has been IP for MH reasons once 13 years ago but has never seen a therapist. Pt sts he has not experienced physical, emotional/verbal or sexual abuse. Pt sts that he sleeps about 5 hours per night because he has trouble getting to sleep because his mind/thoughts are racing. Pt sts he has lost much of his appetite and has lost some weight in the last few months. Pt sts that he was sober for 13 years and then in June 2016, he began to drink alcohol again.  His drinking led to crack cocaine use starting about 1 month ago.   Pt was quietly awake, cooperative and pleasant. Pt spoke in a slow paced, low tone of voice.  Pt moved restlessly in his hospital bed during the assessment. Pt's thought processes were coherent  and relevant. Pt's mood was depressed and his affect was constricted which was congruent. Pt was oriented x 4.   Diagnosis: 311 Unspecified Depressive Disorder; 303.90 Alcohol Use Disorder, Moderate; 304.20 Cocaine Use Disorder, Moderate  Past Medical History:  Past Medical History  Diagnosis Date  . Back pain   . Degenerative disc disease     Past Surgical History  Procedure Laterality Date  . Knee arthroscopy    . Ankle fusion      Family History:  Family History  Problem Relation Age of Onset  . Hypertension Mother   . Diabetes Maternal Aunt   . Cancer Maternal Aunt   . Cancer Maternal Grandmother     Social History:  reports that he has been smoking Cigarettes.  He has been smoking about 0.50 packs per day. He does not have any smokeless tobacco history on file. He reports that he drinks alcohol. He reports that he does not use illicit drugs.  Additional Social History:  Alcohol / Drug Use Prescriptions: See PTA list History of alcohol / drug use?: Yes Longest period of sobriety (when/how long): 13 years- started again in June, 2016 Substance #1 Name of Substance 1: Alcohol 1 - Age of First Use: 15 1 - Amount (size/oz): 3-4 40 oz  beers 1 - Frequency: daily 1 - Duration: 4 months 1 - Last Use / Amount: today Substance #2 Name of Substance 2: Crack cocaine 2 - Age of First Use: 20 2 - Amount (size/oz): "don't know" 2 - Frequency: every other day 2 - Duration: 1 month 2 - Last Use / Amount: yesterday Substance #3 Name of Substance 3: Nicotine 3 - Age of First Use: 15 3 - Amount (size/oz): 1/2 pack 3 - Frequency: daily 3 - Duration: since teens 3 - Last Use / Amount: today  CIWA: CIWA-Ar BP: 102/63 mmHg Pulse Rate: 76 Nausea and Vomiting: no nausea and no vomiting Tactile Disturbances: none Tremor: no tremor Auditory Disturbances: not present Paroxysmal Sweats: no sweat visible Visual Disturbances: not present Anxiety: no anxiety, at ease Headache,  Fullness in Head: none present Agitation: normal activity Orientation and Clouding of Sensorium: oriented and can do serial additions CIWA-Ar Total: 0 COWS:    PATIENT STRENGTHS: (choose at least two) Average or above average intelligence Communication skills  Allergies: No Known Allergies  Home Medications:  (Not in a hospital admission)  OB/GYN Status:  No LMP for male patient.  General Assessment Data Location of Assessment: [facility] TTS Assessment: In system Is this a Tele or Face-to-Face Assessment?: Tele Assessment Is this an Initial Assessment or a Re-assessment for this encounter?: Initial Assessment Marital status: Separated Maiden name: na Is patient pregnant?: No Pregnancy Status: No Living Arrangements: Alone Can pt return to current living arrangement?: Yes Admission Status: Voluntary Is patient capable of signing voluntary admission?: Yes Referral Source: Self/Family/Friend Insurance type: None  Medical Screening Exam [facility]) Medical Exam completed: Yes  Crisis Care Plan Living Arrangements: Alone Name of Psychiatrist: none Name of Therapist: none  Education Status Is patient currently in school?: No Current Grade: na Highest grade of school patient has completed: na Name of school: na Contact person: na  Risk to self with the past 6 months Suicidal Ideation: Yes-Currently Present Has patient been a risk to self within the past 6 months prior to admission? : No Suicidal Intent: Yes-Currently Present Has patient had any suicidal intent within the past 6 months prior to admission? : No Is patient at risk for suicide?: Yes Suicidal Plan?: Yes-Currently Present Has patient had any suicidal plan within the past 6 months prior to admission? : Yes Specify Current Suicidal Plan: OD'd on 8- 15 mg Meloxicam Access to Means: Yes Specify Access to Suicidal Means: prescribed medication What has been your use of drugs/alcohol within the last 12  months?: daily use Previous Attempts/Gestures: Yes How many times?: 1 Other Self Harm Risks: none noted Triggers for Past Attempts: Unpredictable Intentional Self Injurious Behavior: None Family Suicide History: Unknown Recent stressful life event(s): Loss (Comment), Job Loss, Financial Problems, Legal Issues (got fired; separated from wife; felony w/ firearm) Persecutory voices/beliefs?: Yes Depression: Yes Depression Symptoms: Insomnia, Tearfulness, Isolating, Fatigue, Guilt, Loss of interest in usual pleasures, Feeling worthless/self pity, Feeling angry/irritable Substance abuse history and/or treatment for substance abuse?: No Suicide prevention information given to non-admitted patients: Not applicable  Risk to Others within the past 6 months Homicidal Ideation: No (denies) Does patient have any lifetime risk of violence toward others beyond the six months prior to admission? : Yes (comment) Thoughts of Harm to Others: No (denies) Current Homicidal Intent: No (denies) Current Homicidal Plan: No Access to Homicidal Means: No (denies) Identified Victim: na History of harm to others?: Yes Assessment of Violence: In past 6-12 months Violent Behavior  Description: charged with simple assault & felony w a firearm Does patient have access to weapons?: No (denies) Criminal Charges Pending?: Yes Describe Pending Criminal Charges: simple assault & felony w a firearm Does patient have a court date: Yes Court Date:  (court dates in Nov (assault); sts no date yet for felony) Is patient on probation?: No (denies)  Psychosis Hallucinations: Auditory, With command (within last 6 months) Delusions: None noted  Mental Status Report Appearance/Hygiene: In scrubs Eye Contact: Fair Motor Activity: Restlessness Speech: Soft, Logical/coherent Level of Consciousness: Quiet/awake Mood: Depressed, Pleasant Affect: Constricted Anxiety Level: Minimal Thought Processes: Coherent,  Relevant Judgement: Impaired Orientation: Person, Place, Time, Situation Obsessive Compulsive Thoughts/Behaviors: None  Cognitive Functioning Concentration: Fair Memory: Recent Intact, Remote Intact IQ: Average Insight: Fair Impulse Control: Poor Appetite: Fair Weight Loss: 10 (in 2 months) Weight Gain: 0 Sleep: Decreased Total Hours of Sleep: 5 (racing thoughts) Vegetative Symptoms: None  ADLScreening [facility] Assessment Services) Patient's cognitive ability adequate to safely complete daily activities?: Yes Patient able to express need for assistance with ADLs?: Yes Independently performs ADLs?: Yes (appropriate for developmental age)  Prior Inpatient Therapy Prior Inpatient Therapy: Yes Prior Therapy Dates: 2003 Prior Therapy Facilty/Provider(s): another state Reason for Treatment: Depression  Prior Outpatient Therapy Prior Outpatient Therapy: No Prior Therapy Dates: na Prior Therapy Facilty/Provider(s): na Reason for Treatment: na Does patient have an ACCT team?: No Does patient have Intensive In-House Services?  : No Does patient have [facility] services? : No Does patient have P4CC services?: No  ADL Screening (condition at time of admission) Patient's cognitive ability adequate to safely complete daily activities?: Yes Patient able to express need for assistance with ADLs?: Yes Independently performs ADLs?: Yes (appropriate for developmental age)       Abuse/Neglect Assessment (Assessment to be complete while patient is alone) Physical Abuse: Denies Verbal Abuse: Denies Sexual Abuse: Denies Exploitation of patient/patient's resources: Denies Self-Neglect: Denies     Merchant navy officer (For Healthcare) Does patient have an advance directive?: No Would patient like information on creating an advanced directive?: No - patient declined information    Additional Information 1:1 In Past 12 Months?: No CIRT Risk: No Elopement Risk: No Does patient have medical  clearance?: Yes     Disposition:  Disposition Initial Assessment Completed for this Encounter: Yes Disposition of Patient: Other dispositions (Pending review w [facility]) Other disposition(s): Other (Comment)  Per Donell Sievert, PA: Meets IP criteria for dual dx.  Per Clint Bolder, AC: No appropriate bed available at this time. TTS will seek outside placement.  Spoke with Dr. Devoria Albe, EDP at [facility]: Advised her of recommendation. She agreed. Spoke with nurse [place] at [facility]: Advised her of plan.  Beryle Flock, MS, CRC, [facility] Triage Specialist [facility] T 02/21/2015 3:58 AM

## 2015-02-21 NOTE — ED Notes (Signed)
MD Knapp at bedside 

## 2015-02-21 NOTE — Progress Notes (Signed)
Admission note: Brady Kim was admitted from [facility].  He reported that he took 10 Mobic pills and unknown pain medication yesterday in attempt to kill self.  He stated that he has been having problems with his marriage and he and his wife are separated.  He has a pending court case for simple assault against his wife.  He reports that he drinks daily but doesn't feel like he has to drink first thing in the morning to settle his nerves.  He denies history of withdrawal symptoms.  He admits to using crack cocaine and cigarettes but nothing else.  He endorses depression, hopelessness and poor appetite.  He has lost 10 pounds in the past month.  He denies any previous psychiatric history.  His goal is to get clean and sober.  He denies current suicidal ideation.  Oriented to unit.  Belongings secured in locker 54 (wallet, cell phone, belt, EBT, Debit and [place] ID).  Initiated q 15 minute checks for safety.

## 2015-02-21 NOTE — Tx Team (Signed)
Initial Interdisciplinary Treatment Plan   PATIENT STRESSORS: Financial difficulties Legal issue Occupational concerns Substance abuse   PATIENT STRENGTHS: Capable of independent living Motivation for treatment/growth Supportive family/friends   PROBLEM LIST: Problem List/Patient Goals Date to be addressed Date deferred Reason deferred Estimated date of resolution  Substance Abuse 02/21/15     Suicidal thoughts/attempt 02/21/15     "I want to stay clean and sober" 02/21/15     "I want to focus on my finances" 02/21/15                                    DISCHARGE CRITERIA:  Adequate post-discharge living arrangements Improved stabilization in mood, thinking, and/or behavior Reduction of life-threatening or endangering symptoms to within safe limits  PRELIMINARY DISCHARGE PLAN: Attend aftercare/continuing care group Outpatient therapy  PATIENT/FAMIILY INVOLVEMENT: This treatment plan has been presented to and reviewed with the patient, Lourdes SledgeRonnie Harland.  The patient and family have been given the opportunity to ask questions and make suggestions.  Norm ParcelHeather V Rylyn Zawistowski 02/21/2015, 7:09 PM

## 2015-02-21 NOTE — ED Notes (Addendum)
Pt's keys  given to wife per pt request.

## 2015-02-21 NOTE — ED Notes (Signed)
Pt resting. Rise and fall noted to chest. VSS. Sitter at bedside. Will continue to monitor.

## 2015-02-21 NOTE — ED Notes (Signed)
Poison Control called to check status of patient.  This RN made them aware of VS and results to labs and informed them of patient meeting  Psychiatric inpatient criteria.

## 2015-02-21 NOTE — ED Notes (Signed)
Pt requesting to call wife. Her name is Marcelino DusterMichelle and number [phone].

## 2015-02-21 NOTE — ED Notes (Signed)
Pt reporting right sided chest pain. VSS will notify MD.

## 2015-02-21 NOTE — Consult Note (Signed)
[facility] Face-to-Face Psychiatry Consult   Reason for Consult:  Suicide attempt, depression, alcohol abuse  MRN:  [id number] Principal Diagnosis: Alcohol use disorder, severe, dependence ([place]) Diagnosis:   Patient Active Problem List   Diagnosis Date Noted  . Alcohol use disorder, severe, dependence ([place]) [F10.20] 02/21/2015    Priority: High  . Severe major depression, single episode, without psychotic features ([place]) [F32.2] 02/21/2015    Priority: High  . Right knee pain [M25.561] 06/27/2013  . Smoking [Z72.0] 06/27/2013  . Chronic low back pain [M54.5, G89.29] 03/16/2013    Total Time spent with patient: 45 minutes  Subjective:   Brady Kim is a 49 y.o. male patient admitted after he attempted suicide by overdose.  HPI:  Patient with history of  Alcohol dependence who was brought to the ED after he overdosed on pills, beer and crack cocaine. He reports being stressed out and overwhelmed. States he has been married for 9 years and his wife has asked for separation, he lost his home and needs to move, he was fired from his job 2 weeks ago. He was sober from drinking for 13 years and started drinking again in June, [place] he has been treated for depression in the past and has tried to OD years ago. His last admission to psych hospital was about 13 years ago after suicide attempt by OD.Prior to presentation at the hospital, he took 8 meloxicam 15 mg tabs, drank 12 packs of beer and smoked cocaine. Pt sts that within the last 6 months, he has had AH, hearing voices talking to him. Pt sts that the voices he heard could possibly be substance induced. Pt has symptoms of depression including deep sadness, excessive guilt, lower self esteem, tearfulness, self isolating, lack of motivation for activities, irritability, feeling helpless and hopeless.Patient is requesting for inpatient admission for stabilization.  Past Psychiatric History: Major depression, Alcohol dependence  Risk to Self: Suicidal  Ideation: Yes-Currently Present Suicidal Intent: Yes-Currently Present Is patient at risk for suicide?: Yes Suicidal Plan?: Yes-Currently Present Specify Current Suicidal Plan: OD'd on 8- 15 mg Meloxicam Access to Means: Yes Specify Access to Suicidal Means: prescribed medication What has been your use of drugs/alcohol within the last 12 months?: daily use How many times?: 1 Other Self Harm Risks: none noted Triggers for Past Attempts: Unpredictable Intentional Self Injurious Behavior: None Risk to Others: Homicidal Ideation: No (denies) Thoughts of Harm to Others: No (denies) Current Homicidal Intent: No (denies) Current Homicidal Plan: No Access to Homicidal Means: No (denies) Identified Victim: na History of harm to others?: Yes Assessment of Violence: In past 6-12 months Violent Behavior Description: charged with simple assault & felony w a firearm Does patient have access to weapons?: No (denies) Criminal Charges Pending?: Yes Describe Pending Criminal Charges: simple assault & felony w a firearm Does patient have a court date: Yes Court Date:  (court dates in Nov (assault); sts no date yet for felony) Prior Inpatient Therapy: Prior Inpatient Therapy: Yes Prior Therapy Dates: 2003 Prior Therapy Facilty/Provider(s): [place] state Reason for Treatment: Depression Prior Outpatient Therapy: Prior Outpatient Therapy: No Prior Therapy Dates: na Prior Therapy Facilty/Provider(s): na Reason for Treatment: na Does patient have an ACCT team?: No Does patient have Intensive In-House Services?  : No Does patient have [facility] services? : No Does patient have P4CC services?: No  Past Medical History:  Past Medical History  Diagnosis Date  . Back pain   . Degenerative disc disease     Past Surgical History  Procedure Laterality Date  . Knee arthroscopy    . Ankle fusion     Family History:  Family History  Problem Relation Age of Onset  . Hypertension Mother   . Diabetes  Maternal Aunt   . Cancer Maternal Aunt   . Cancer Maternal Grandmother    Family Psychiatric  History:  Social History:  History  Alcohol Use  . Yes    Comment: Occasional     History  Drug Use No    Social History   Social History  . Marital Status: Married    Spouse Name: N/A  . Number of Children: N/A  . Years of Education: N/A   Social History Main Topics  . Smoking status: Current Every Day Smoker -- 0.50 packs/day    Types: Cigarettes  . Smokeless tobacco: None  . Alcohol Use: Yes     Comment: Occasional  . Drug Use: No  . Sexual Activity: Not Asked   Other Topics Concern  . None   Social History Narrative   Additional Social History:    Prescriptions: See PTA list History of alcohol / drug use?: Yes Longest period of sobriety (when/how long): 13 years- started again in June, 2016 Name of Substance 1: Alcohol 1 - Age of First Use: 15 1 - Amount (size/oz): 3-4 40 oz beers 1 - Frequency: daily 1 - Duration: 4 months 1 - Last Use / Amount: today Name of Substance 2: Crack cocaine 2 - Age of First Use: 20 2 - Amount (size/oz): "don't know" 2 - Frequency: every other day 2 - Duration: 1 month 2 - Last Use / Amount: yesterday Name of Substance 3: Nicotine 3 - Age of First Use: 15 3 - Amount (size/oz): 1/2 pack 3 - Frequency: daily 3 - Duration: since teens 3 - Last Use / Amount: today               Allergies:  No Known Allergies  Labs:  Results for orders placed or performed during the hospital encounter of 02/21/15 (from the past 48 hour(s))  Urine rapid drug screen (hosp performed) (Not at [facility])     Status: Abnormal   Collection Time: 02/21/15  1:03 AM  Result Value Ref Range   Opiates NONE DETECTED NONE DETECTED   Cocaine POSITIVE (A) NONE DETECTED   Benzodiazepines NONE DETECTED NONE DETECTED   Amphetamines NONE DETECTED NONE DETECTED   Tetrahydrocannabinol NONE DETECTED NONE DETECTED   Barbiturates NONE DETECTED NONE DETECTED     Comment:        DRUG SCREEN FOR MEDICAL PURPOSES ONLY.  IF CONFIRMATION IS NEEDED FOR ANY PURPOSE, NOTIFY LAB WITHIN 5 DAYS.        LOWEST DETECTABLE LIMITS FOR URINE DRUG SCREEN Drug Class       Cutoff (ng/mL) Amphetamine      1000 Barbiturate      200 Benzodiazepine   269 Tricyclics       485 Opiates          300 Cocaine          300 THC              50   Comprehensive metabolic panel     Status: Abnormal   Collection Time: 02/21/15  1:08 AM  Result Value Ref Range   Sodium 137 135 - 145 mmol/L   Potassium 3.5 3.5 - 5.1 mmol/L   Chloride 103 101 - 111 mmol/L   CO2 25 22 - 32 mmol/L  Glucose, Bld 89 65 - 99 mg/dL   BUN 8 6 - 20 mg/dL   Creatinine, Ser 0.88 0.61 - 1.24 mg/dL   Calcium 8.8 (L) 8.9 - 10.3 mg/dL   Total Protein 6.9 6.5 - 8.1 g/dL   Albumin 4.1 3.5 - 5.0 g/dL   AST 12 (L) 15 - 41 U/L   ALT 18 17 - 63 U/L   Alkaline Phosphatase 66 38 - 126 U/L   Total Bilirubin 0.4 0.3 - 1.2 mg/dL    Comment: REPEATED TO VERIFY   GFR calc non Af Amer >60 >60 mL/min   GFR calc Af Amer >60 >60 mL/min    Comment: (NOTE) The eGFR has been calculated using the CKD EPI equation. This calculation has not been validated in all clinical situations. eGFR's persistently <60 mL/min signify possible Chronic Kidney Disease.    Anion gap 9 5 - 15  Ethanol (ETOH)     Status: Abnormal   Collection Time: 02/21/15  1:08 AM  Result Value Ref Range   Alcohol, Ethyl (B) 143 (H) <5 mg/dL    Comment:        LOWEST DETECTABLE LIMIT FOR SERUM ALCOHOL IS 5 mg/dL FOR MEDICAL PURPOSES ONLY   Salicylate level     Status: None   Collection Time: 02/21/15  1:08 AM  Result Value Ref Range   Salicylate Lvl <4.9 2.8 - 30.0 mg/dL  Acetaminophen level     Status: Abnormal   Collection Time: 02/21/15  1:08 AM  Result Value Ref Range   Acetaminophen (Tylenol), Serum <10 (L) 10 - 30 ug/mL    Comment:        THERAPEUTIC CONCENTRATIONS VARY SIGNIFICANTLY. A RANGE OF 10-30 ug/mL MAY BE AN  EFFECTIVE CONCENTRATION FOR MANY PATIENTS. HOWEVER, SOME ARE BEST TREATED AT CONCENTRATIONS OUTSIDE THIS RANGE. ACETAMINOPHEN CONCENTRATIONS >150 ug/mL AT 4 HOURS AFTER INGESTION AND >50 ug/mL AT 12 HOURS AFTER INGESTION ARE OFTEN ASSOCIATED WITH TOXIC REACTIONS.   CBC     Status: None   Collection Time: 02/21/15  1:08 AM  Result Value Ref Range   WBC 8.3 4.0 - 10.5 K/uL   RBC 4.65 4.22 - 5.81 MIL/uL   Hemoglobin 14.7 13.0 - 17.0 g/dL   HCT 43.1 39.0 - 52.0 %   MCV 92.7 78.0 - 100.0 fL   MCH 31.6 26.0 - 34.0 pg   MCHC 34.1 30.0 - 36.0 g/dL   RDW 13.5 11.5 - 15.5 %   Platelets 248 150 - 400 K/uL  Troponin I     Status: None   Collection Time: 02/21/15  1:08 AM  Result Value Ref Range   Troponin I <0.03 <0.031 ng/mL    Comment:        NO INDICATION OF MYOCARDIAL INJURY.   CBG monitoring, ED     Status: None   Collection Time: 02/21/15  1:32 AM  Result Value Ref Range   Glucose-Capillary 77 65 - 99 mg/dL  Troponin I     Status: None   Collection Time: 02/21/15  3:52 AM  Result Value Ref Range   Troponin I <0.03 <0.031 ng/mL    Comment:        NO INDICATION OF MYOCARDIAL INJURY.     Current Facility-Administered Medications  Medication Dose Route Frequency Provider Last Rate Last Dose  . acetaminophen (TYLENOL) tablet 650 mg  650 mg Oral Q4H PRN Rolland Porter, MD      . alum & mag hydroxide-simeth (MAALOX/MYLANTA) 200-200-20 MG/5ML suspension 30  mL  30 mL Oral PRN Rolland Porter, MD      . LORazepam (ATIVAN) tablet 0-4 mg  0-4 mg Oral 4 times per day Rolland Porter, MD   0 mg at 02/21/15 0318   Followed by  . [START ON 02/23/2015] LORazepam (ATIVAN) tablet 0-4 mg  0-4 mg Oral Q12H Rolland Porter, MD      . nicotine (NICODERM CQ - dosed in mg/24 hours) patch 21 mg  21 mg Transdermal Daily Rolland Porter, MD      . ondansetron (ZOFRAN) tablet 4 mg  4 mg Oral Q8H PRN Rolland Porter, MD      . pantoprazole (PROTONIX) EC tablet 20 mg  20 mg Oral Daily Rolland Porter, MD      . thiamine (VITAMIN B-1)  tablet 100 mg  100 mg Oral Daily Rolland Porter, MD       Or  . thiamine (B-1) injection 100 mg  100 mg Intravenous Daily Rolland Porter, MD      . zolpidem (AMBIEN) tablet 10 mg  10 mg Oral QHS PRN Rolland Porter, MD       Current Outpatient Prescriptions  Medication Sig Dispense Refill  . ibuprofen (ADVIL,MOTRIN) 200 MG tablet Take 200-400 mg by mouth every 6 (six) hours as needed for mild pain or moderate pain.    . meloxicam (MOBIC) 15 MG tablet Take 120 mg by mouth once.    Marland Kitchen amoxicillin-clavulanate (AUGMENTIN) 875-125 MG per tablet Take 1 tablet by mouth every 12 (twelve) hours. (Patient not taking: Reported on 02/21/2015) 14 tablet 0  . butalbital-acetaminophen-caffeine (ESGIC) 50-325-40 MG per tablet Take 1 tablet by mouth every 6 (six) hours as needed for headache. (Patient not taking: Reported on 09/20/2014) 30 tablet 0  . carisoprodol (SOMA) 350 MG tablet Take 1 tablet (350 mg total) by mouth at bedtime. (Patient not taking: Reported on 09/20/2014) 30 tablet 0  . famotidine (PEPCID) 20 MG tablet Take 1 tablet (20 mg total) by mouth 2 (two) times daily. (Patient not taking: Reported on 09/20/2014) 60 tablet 6  . HYDROcodone-acetaminophen (NORCO/VICODIN) 5-325 MG per tablet Take 1 tablet by mouth every 4 (four) hours as needed for moderate pain. (Patient not taking: Reported on 02/21/2015) 12 tablet 0  . ibuprofen (ADVIL,MOTRIN) 800 MG tablet Take 1 tablet (800 mg total) by mouth 3 (three) times daily. (Patient not taking: Reported on 09/20/2014) 21 tablet 0  . naproxen (NAPROSYN) 500 MG tablet Take 1 tablet (500 mg total) by mouth 2 (two) times daily with a meal. (Patient not taking: Reported on 09/20/2014) 30 tablet 6  . nicotine (NICODERM CQ) 21 mg/24hr patch Place 1 patch (21 mg total) onto the skin daily. (Patient not taking: Reported on 09/20/2014) 28 patch 0  . predniSONE (STERAPRED UNI-PAK) 10 MG tablet 6 tabs po day 1, 5 tabs po day 2, 4 tabs po day 3, 3 tabs po day 4, 2 tabs po day 5, 1 tab po day 6  (Patient not taking: Reported on 09/20/2014) 21 tablet 0  . traMADol (ULTRAM) 50 MG tablet Take 1 tablet (50 mg total) by mouth every 6 (six) hours as needed. (Patient not taking: Reported on 09/20/2014) 90 tablet 3    Musculoskeletal: Strength & Muscle Tone: within normal limits Gait & Station: normal Patient leans: N/A  Psychiatric Specialty Exam: Review of Systems  HENT: Negative.   Eyes: Negative.   Respiratory: Negative.   Cardiovascular: Negative.   Gastrointestinal: Positive for nausea.  Genitourinary: Negative.   Musculoskeletal:  Positive for myalgias.  Skin: Negative.   Neurological: Positive for tremors and weakness.  Endo/Heme/Allergies: Negative.   Psychiatric/Behavioral: Positive for depression, suicidal ideas and substance abuse. The patient is nervous/anxious and has insomnia.     Blood pressure 103/64, pulse 81, temperature 97.8 F (36.6 C), temperature source Oral, resp. rate 18, SpO2 97 %.There is no weight on file to calculate BMI.  General Appearance: Casual  Eye Contact::  Good  Speech:  Slow  Volume:  Decreased  Mood:  Depressed, Dysphoric and Hopeless  Affect:  Constricted  Thought Process:  Goal Directed  Orientation:  Full (Time, Place, and Person)  Thought Content:  Negative  Suicidal Thoughts:  Yes.  without intent/plan  Homicidal Thoughts:  No  Memory:  Immediate;   Good Recent;   Good Remote;   Good  Judgement:  Impaired  Insight:  Fair  Psychomotor Activity:  Decreased  Concentration:  Fair  Recall:  Good  Fund of Knowledge:Good  Language: Good  Akathisia:  No  Handed:  Right  AIMS (if indicated):     Assets:  Communication Skills Desire for Improvement  ADL's:  Intact  Cognition: WNL  Sleep:   poor   Treatment Plan Summary: Daily contact with patient to assess and evaluate symptoms and progress in treatment and Medication management  Disposition: Recommend psychiatric Inpatient admission when medically cleared. Supportive therapy  provided about ongoing stressors.  Corena Pilgrim, MD 02/21/2015 11:26 AM

## 2015-02-21 NOTE — ED Notes (Signed)
Brady Kim from poison control reports to obtain tylenol level, Electrolyte panel, and EKG. Observe for 4 hours. Nausea is to be expected and can give Zofran. She recommends IV fluids and supportive care. Benzo's for agitation.

## 2015-02-21 NOTE — ED Notes (Addendum)
[facility]/o overdose on a quantity of (8) 15 mg meloxicam about 2300, smoked crack, drank a 12 pack of beer and some gin. He reports he took the meloxicam to kill self but then decided he did not want to die and called 911. He reports increased stress lately (lost job, felony, and wife told him to move out). Pt is endorsing some nausea. Pt is drowsy. He reports to this RN that he has done this before and [facility] and [facility].

## 2015-02-21 NOTE — ED Notes (Signed)
Attempted lab draw x 2 but unsuccessful.RN,Brittany made aware.

## 2015-02-21 NOTE — ED Notes (Signed)
Pt wanded by security. 

## 2015-02-21 NOTE — ED Notes (Signed)
Psychiatrist at bedside

## 2015-02-21 NOTE — ED Notes (Signed)
Sitter at bedside.

## 2015-02-21 NOTE — ED Notes (Signed)
[facility]  All belongings given to the [place] driver.  Patient left the unit ambulatory.

## 2015-02-21 NOTE — ED Notes (Signed)
Patient's wife and father are at bedside.

## 2015-02-21 NOTE — ED Notes (Signed)
Sitter Krista at bedside.

## 2015-02-22 ENCOUNTER — Encounter (HOSPITAL_COMMUNITY): Payer: Self-pay | Admitting: Psychiatry

## 2015-02-22 DIAGNOSIS — F322 Major depressive disorder, single episode, severe without psychotic features: Principal | ICD-10-CM

## 2015-02-22 DIAGNOSIS — F1994 Other psychoactive substance use, unspecified with psychoactive substance-induced mood disorder: Secondary | ICD-10-CM | POA: Diagnosis present

## 2015-02-22 NOTE — Tx Team (Addendum)
Interdisciplinary Treatment Plan Update (Adult)  Date:  02/22/2015  Time Reviewed:  8:38 AM   Progress in Treatment: Attending groups:  Yes  Participating in groups: Yes Taking medication as prescribed:  Yes. Tolerating medication:  Yes. Family/Significant othe contact made:   Patient understands diagnosis:  Yes. and As evidenced by:  seeking treatment for depression, ETOH detox/crack cocaine abuse, SI, and med stabilization Discussing patient identified problems/goals with staff:  Yes. Medical problems stabilized or resolved:  Yes. Denies suicidal/homicidal ideation: Yes. Issues/concerns per patient self-inventory:  Other:  Discharge Plan or Barriers:  Pt moving into his mother's home on Sunday. Pt plans to follow-up at [facility] and was given mental health association information and AA/NA list for [facility].   Reason for Continuation of Hospitalization: Medication stabilization   Comments:  Brady Kim is an 49 y.o. separated male who was brought into the [facility]. Pt sts he took an OD of 8-86m meloxicam with a 12 pk of beer, some gin and smoked some crack cocaine about 11 pm in an attempt to kill himself. Pt sts that he later changed his mind and called 911. Pt sts that he still feels as if he might hurt himself. Pt sts that he has been in a state of decline over the last 4 months. Pt sts that about 4 months ago his job as a dGeophysicist/field seismologistwent from [employer], then he was fired about 2 weeks ago. Pt sts that he was arrested and charged with a felony with a firearm & simple assault. Pt gave no further details. Pt sts that after 9 years of marriage his wife asked him to move out. Pt denies HI, SHI and recent AVH. Pt sts that within the last 6 months, he has had AH, hearing voices talking to him. Pt sts that the voices he heard could possibly be substance induced. Pt has symptoms of depression including deep sadness, excessive guilt, lower self esteem, tearfulness, self isolating,  lack of motivation for activities, irritability, feeling helpless and hopeless. Pt sts that he is not on medication for depression, does not have a psychiatrist or a therapist. Pt has been IP for MSwanreasons once 13 years ago but has never seen a therapist. Pt sts he has not experienced physical, emotional/verbal or sexual abuse. Pt sts that he sleeps about 5 hours per night because he has trouble getting to sleep because his mind/thoughts are racing. Pt sts he has lost much of his appetite and has lost some weight in the last few months. Pt sts that he was sober for 13 years and then in June 2016, he began to drink alcohol again. His drinking led to crack cocaine use starting about 1 month ago. Diagnosis upon admission:  Unspecified Depressive Disorder; 303.90 Alcohol Use Disorder, Moderate; 304.20 Cocaine Use Disorder, Moderate  Estimated length of stay:  2 days (d/c on Sunday) -pts father to pick him up after 1:30PM.  New goal(s): to develop effective aftercare plan.   Additional Comments:  Patient and CSW reviewed pt's identified goals and treatment plan. Patient verbalized understanding and agreed to treatment plan. [facility]. Pt verbalized understanding of information provided and signed form.    Review of initial/current patient goals per problem list:  1. Goal(s): Patient will participate in aftercare plan  Met: Yes  Target date: at discharge  As evidenced by: Patient will participate within aftercare plan AEB aftercare provider and housing plan at discharge  being identified.  10/28: Pt to follow-up at [facility] for mental health services and plans to return home with his parents.   2. Goal (s): Patient will exhibit decreased depressive symptoms and suicidal ideations.  Met: Yes    Target date: at discharge  As evidenced by: Patient will utilize self rating of depression at 3 or below and demonstrate decreased signs of  depression or be deemed stable for discharge by MD.  10/28: Pt rates depression as 3/10 this afternoon. Pleasant/calm. Denies SI/HI/AVH 02/22/2015 2:32 PM   3. Goal(s): Patient will demonstrate decreased signs of withdrawal due to substance abuse  Met:No.   Target date:at discharge   As evidenced by: Patient will produce a CIWA/COWS score of 0, have stable vitals signs, and no symptoms of withdrawal.  10/28: Pt reports moderate withdrawals with no COWS/CIWA score of 5 and stable vitals. Per Dr. Parke Poisson, pt is medically stable for d/c on Sunday.   Attendees: Patient:   02/22/2015 8:38 AM   Family:   02/22/2015 8:38 AM   Physician:  Dr. Parke Poisson MD  02/22/2015 8:38 AM   Nursing:   Elio Forget RN 02/22/2015 8:38 AM   Clinical Social Worker: Maxie Better, [place]  02/22/2015 8:38 AM   Clinical Social Worker: Erasmo Downer Drinkard LCSWA; Peri Maris LCSWA 02/22/2015 8:38 AM   Other:  Gerline Legacy Nurse Case Manager 02/22/2015 8:38 AM   Other:  Lucinda Dell; [facility]  02/22/2015 8:38 AM   Other:   02/22/2015 8:38 AM   Other:  02/22/2015 8:38 AM   Other:  02/22/2015 8:38 AM   Other:  02/22/2015 8:38 AM    02/22/2015 8:38 AM    02/22/2015 8:38 AM    02/22/2015 8:38 AM    02/22/2015 8:38 AM    Scribe for Treatment Team:   Maxie Better, [place]  02/22/2015 8:38 AM

## 2015-02-22 NOTE — [facility] Suicide Risk Assessment (Signed)
[facility] Admission Suicide Risk Assessment   Nursing information obtained from:  Patient Demographic factors:  Living alone, Unemployed Current Mental Status:  Self-harm thoughts Loss Factors:  Loss of significant relationship, Legal issues Historical Factors:  Prior suicide attempts Risk Reduction Factors:  NA Total Time spent with patient: 45 minutes Principal Problem: Severe major depression, single episode, without psychotic features (HCC) Diagnosis:   Patient Active Problem List   Diagnosis Date Noted  . Alcohol use disorder, severe, dependence (HCC) [F10.20] 02/21/2015  . 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 Right knee pain [M25.561] 06/27/2013  . Smoking [Z72.0] 06/27/2013  . Chronic low back pain [M54.5, G89.29] 03/16/2013     Continued Clinical Symptoms:  Alcohol Use Disorder Identification Test Final Score (AUDIT): 9 The "Alcohol Use Disorders Identification Test", Guidelines for Use in Primary Care, Second Edition.  World Science writer [facility]). Score between 0-7:  no or low risk or alcohol related problems. Score between 8-15:  moderate risk of alcohol related problems. Score between 16-19:  high risk of alcohol related problems. Score 20 or above:  warrants further diagnostic evaluation for alcohol dependence and treatment.   CLINICAL FACTORS:  49 year old male presented to ED reporting suicidal attempt by overdosing on 8 meloxicams, alcohol, cocaine . States he had consumed a large number of beers and also some liquor . Reports he has been depressed due to worsening psychosocial stressors, which include recent separation, recent loss of job,legal difficulties, charges . Patient is not currently presenting with any significant withdrawal symptoms- no tremors , no diaphoresis, no psychomotor restlessness or agitation. States he is feeling better now that he is off effects of alcohol and cocaine . Denies any  current Suicidal ideation. At this time he is focused on being discharged soon as he needs to move furniture, belongings out of his apartment before being eviction, losing them. Dx- Substance Induced Mood Disorder , Depressed  Plan- inpatient psychiatric admission- start PROZAC 10 mgrs Daily to address depression. ATIVAN PRNS for possible symptoms of alcohol WDL. Consider discharge soon if patient continues to stabilize .   Musculoskeletal: Strength & Muscle Tone: within normal limits Gait & Station: normal Patient leans: N/A  Psychiatric Specialty Exam: Physical Exam  ROS denies chest pain, denies shortness of breath, no vomiting, no rash   Blood pressure 111/80, pulse 72, temperature 99 F (37.2 C), temperature source Oral, resp. rate 16, height  (1.803 m), weight 141 lb (63.957 kg).Body mass index is 19.67 kg/(m^2).  General Appearance: Fairly Groomed  Patent attorney::  Good  Speech:  Normal Rate  Volume:  Normal  Mood:  states today he is feeling better, and minimizes depression at this time  Affect:  Appropriate and reactive affect at present   Thought Process:  Goal Directed and Linear  Orientation:  Full (Time, Place, and Person)  Thought Content:  denies hallucinations at present, no delusions expressed, not internally preoccupied   Suicidal Thoughts:  No at this time denies any thoughts of hurting self , denies any thoughts of SI, and co/ntracts for safety on the unit   Homicidal Thoughts:  No  Memory:  recent and remote grossly intact   Judgement:  Fair  Insight:  Fair  Psychomotor Activity:  Normal- no current symptoms of withdrawal- no tremors, no diaphoresis, no psychomotor agitation or restlessness   Concentration:  Good  Recall:  Good  Fund of Knowledge:Good  Language: Good  Akathisia:  No  Handed:  Right  AIMS (if indicated):     Assets:  Communication Skills Desire for Improvement Resilience  Sleep:     Cognition: WNL  ADL's:  Intact     COGNITIVE  FEATURES THAT CONTRIBUTE TO RISK:  Closed-mindedness and Loss of executive function    SUICIDE RISK:   Mild:  Suicidal ideation of limited frequency, intensity, duration, and specificity.  There are no identifiable plans, no associated intent, mild dysphoria and related symptoms, good self-control (both objective and subjective assessment), few other risk factors, and identifiable protective factors, including available and accessible social support.  PLAN OF CARE: Patient will be admitted to inpatient psychiatric unit for stabilization and safety. Will provide and encourage milieu participation. Provide medication management and maked adjustments as needed.  Will follow daily.    Medical Decision Making:  Established Problem, Stable/Improving (1), Review of Psycho-Social Stressors (1), Review or order clinical lab tests (1) and Review of Medication Regimen & Side Effects (2)  I certify that inpatient services furnished can reasonably be expected to improve the patient's condition.   COBOS, FERNANDO 02/22/2015, 6:02 PM

## 2015-02-22 NOTE — Progress Notes (Signed)
NUTRITION ASSESSMENT  Pt identified as at risk on the Malnutrition Screen Tool  INTERVENTION: 1. Educated patient on the importance of nutrition and encouraged intake of food and beverages. 2. Discussed weight goals. 3. Supplements: continue Ensure Enlive BID, each supplement provides 350 kcal and 20 grams of protein  NUTRITION DIAGNOSIS: Unintentional weight loss related to sub-optimal intake as evidenced by pt report.   Goal: Pt to meet >/= 90% of their estimated nutrition needs.  Monitor:  PO intake  Assessment:  Pt seen for MST. Pt admitted for SI following attempt. He states that he drinks daily and has been stressed due to social and financial issues.   Per review, pt has lost 13 lbs (8.4% body weight) in the past 6 months which is not significant for time frame.  Ensure Shiela Mayernlive has already been ordered BID.  49 y.o. male  Height: Ht Readings from Last 1 Encounters:  02/21/15 5\' 11"  (1.803 m)    Weight: Wt Readings from Last 1 Encounters:  02/21/15 141 lb (63.957 kg)    Weight Hx: Wt Readings from Last 10 Encounters:  02/21/15 141 lb (63.957 kg)  08/24/14 154 lb 12.8 oz (70.217 kg)  01/10/14 156 lb (70.761 kg)  06/27/13 158 lb (71.668 kg)  05/25/13 155 lb (70.308 kg)  03/15/13 154 lb (69.854 kg)  03/10/13 154 lb (69.854 kg)  12/11/12 155 lb (70.308 kg)    BMI:  Body mass index is 19.67 kg/(m^2). Pt meets criteria for normal weight based on current BMI.  Estimated Nutritional Needs: Kcal: 25-30 kcal/kg Protein: > 1 gram protein/kg Fluid: 1 ml/kcal  Diet Order: Diet regular Room service appropriate?: Yes; Fluid consistency:: Thin Pt is also offered choice of unit snacks mid-morning and mid-afternoon.  Pt is eating as desired.   Lab results and medications reviewed.      Trenton GammonJessica Shakim Faith, RD, LDN Inpatient Clinical Dietitian Pager # [phone] After hours/weekend pager # [phone]

## 2015-02-22 NOTE — Progress Notes (Signed)
Patient did attend the evening speaker AA meeting.  

## 2015-02-22 NOTE — [facility] Group Notes (Signed)
[facility] LCSW Group Therapy  02/22/2015 1:04 PM  Type of Therapy:  Group Therapy  Participation Level:  Active  Participation Quality:  Attentive  Affect:  Appropriate  Cognitive:  Alert and Oriented  Insight:  Engaged  Engagement in Therapy:  Engaged  Modes of Intervention:  Confrontation, Discussion, Education, Exploration, Problem-solving, Rapport Building, Socialization and Support  Summary of Progress/Problems: Feelings around Relapse. Group members discussed the meaning of relapse and shared personal stories of relapse, how it affected them and others, and how they perceived themselves during this time. Group members were encouraged to identify triggers, warning signs and coping skills used when facing the possibility of relapse. Social supports were discussed and explored in detail. Post Acute Withdrawal Syndrome (handout provided) was introduced and examined. Pt's were encouraged to ask questions, talk about key points associated with PAWS, and process this information in terms of relapse prevention. Brady Kim was attentive and engaged during today's processing group.  He shared his past experiences with PAWS and how he managed to stay sober for 13 years. Pt reports that he plans to follow-up at [facility] and live with his parents for awhile, until he feels stable enough to live on his own again. "I'm open to help now. I know what I need to do." Pt also talked about letting go of "negative people and surrounding myself with positive people."   Smart, Brady Kim LCSWA  02/22/2015, 1:04 PM

## 2015-02-22 NOTE — [facility] Suicide Risk Assessment (Signed)
[facility] INPATIENT:  Family/Significant Other Suicide Prevention Education  Suicide Prevention Education:  Education Completed; Stephania FragminCharles Stubbs (pt's father) [phone] has been identified by the patient as the family member/significant other with whom the patient will be residing, and identified as the person(s) who will aid the patient in the event of a mental health crisis (suicidal ideations/suicide attempt).  With written consent from the patient, the family member/significant other has been provided the following suicide prevention education, prior to the and/or following the discharge of the patient.  The suicide prevention education provided includes the following:  Suicide risk factors  Suicide prevention and interventions  National Suicide Hotline telephone number  [facility] assessment telephone number  [facility] Emergency Assistance 911  [facility] [place] and/or Residential Mobile Crisis Unit telephone number  Request made of family/significant other to:  Remove weapons (e.g., guns, rifles, knives), all items previously/currently identified as safety concern.    Remove drugs/medications (over-the-counter, prescriptions, illicit drugs), all items previously/currently identified as a safety concern.  The family member/significant other verbalizes understanding of the suicide prevention education information provided.  The family member/significant other agrees to remove the items of safety concern listed above.  Smart, Shayona Hibbitts  LCSWA  02/22/2015, 2:18 PM

## 2015-02-22 NOTE — [facility] Counselor (Signed)
Adult Comprehensive Assessment  Patient ID: Brady SledgeRonnie Freilich, male   DOB: 02/19/1966, 49 y.o.   MRN: [id number]  Information Source: Information source: Patient  Current Stressors:  Physical health (include injuries & life threatening diseases): none identified.  Bereavement / Loss: separated from wife. working on marriage.   Living/Environment/Situation:  Living Arrangements: Alone Living conditions (as described by patient or guardian): Pt reports that he is moving into new home. "not really a good thing but it is what it is."  How long has patient lived in current situation?: few months  What is atmosphere in current home: Temporary  Family History:  Marital status: Separated Separated, when?: "I've been separated since March. Trying to work things out."  What types of issues is patient dealing with in the relationship?: assault charge on her. firearm charge caused strain Additional relationship information: [place] years [place].  Does patient have children?: No  Childhood History:  By whom was/is the patient raised?: Mother Additional childhood history information: My mother raised me. She was married to my stepdad. Dad was around at times. Description of patient's relationship with caregiver when they were a child: Close to mom-"I'm moving in with her." Dad and I are fairly close.  Patient's description of current relationship with people who raised him/her: My relationship with my mom is wonderful. My dad and I are close too.  Does patient have siblings?: No Did patient suffer any verbal/emotional/physical/sexual abuse as a child?: No Did patient suffer from severe childhood neglect?: No Has patient ever been sexually abused/assaulted/raped as an adolescent or adult?: No Was the patient ever a victim of a crime or a disaster?: No Witnessed domestic violence?: No Has patient been effected by domestic violence as an adult?: No  Education:  Highest grade of school  patient has completed: 9th grade. I just stopped going to school.  Currently a student?: No Name of school: n/a  Learning disability?: No  Employment/Work Situation:   Employment situation: Unemployed (unemployed for about month) Patient's job has been impacted by current illness: No (getting let go led to my relapse.) What is the longest time patient has a held a job?: few years. 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  Has patient ever been in the [employer] and Companymilitary?: No Has patient ever served in combat?: No  Financial Resources:   Surveyor, quantityinancial resources: Support from parents / caregiver, Food stamps Does patient have a Lawyerrepresentative payee or guardian?: No  Alcohol/Substance Abuse:   What has been your use of drugs/alcohol within the last 12 months?: alcohol use daily since March 2016-"around a 6pk daily." Crack cocaine "I don't do it every day. Mayabe twice a week I might do $40 worth."  If attempted suicide, did drugs/alcohol play a role in this?: Yes ( I took some old medication and got really depressed. I was intoxicated. I changed my mind last minute and called 911. This is the second time. ) Alcohol/Substance Abuse Treatment Hx: Past Tx, Inpatient, Past Tx, Outpatient If yes, describe treatment: [facility] years ago 2x. [facility] for detox several yeasrs ago. I used to go to [facility] but I didn't continue. "I had 13 years clean before this most recent relapse. I know what I need to do."  Has alcohol/substance abuse ever caused legal problems?: Yes (had firearm. I'm a felon and it wasn't registered. 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  Leisure/Recreation:   Leisure and Hobbies: Trying to get my drugs and alcohol.  Strengths/Needs:   What things does the patient do well?: I know how to get sober and stay sober. I know what my problem is, I need to do things differently In what areas does patient struggle / problems for patient: coping with unexpected stress. "The fire's got me right now."   Discharge Plan:   Does patient have access to transportation?: Yes (I drive and have a license) Will patient be returning to same living situation after discharge?: Yes (return home with mom) Currently receiving community mental health services: No If no, would patient like referral for services when discharged?: Yes (What county?) [place]) Does patient have financial barriers related to discharge medications?: Yes Patient description of barriers related to discharge medications: No income/very limited money. no insurance.   Summary/Recommendations:    Pt is 49 year old male living in [place], [place] Pt presents to [facility] after overdosing on medication in suicide attempt, for ETOH detox, crack cocaine abuse, and for medication management. Pt reports that he changed his mind after taking the pills. Pt states that he is moving in with his mother at d/c due to financial issues and recent job loss. He states that he had 13 years sober prior to relapse in March 2016 that was triggered by problems in his marriage followed by possession of illegal firearm charge. Pt denies SI/HI/AVH currently. Pt has no providers. He is willing to go to [facility] for med management/cousneling and hopes to be prescribed an antidepressant while at [facility]. CSW assessing. Recommendations for pt include: crisis stabilization, therapeutic milieu, encourage group attendance and participation, and development of comprehensive mental wellness/sobriety plan. Pt also given information to AA/NA and [place] [facility] [place].   Smart, Elizabeth Paulsen LCSWA 02/22/2015 10:52 AM

## 2015-02-22 NOTE — Progress Notes (Signed)
NS[facility] 7a-7p shift:   D:  Pt. Has been depressed, but verbalizes wanting to make positive changes in his life.  He stated that he slept well last night and denied any physical complaints, but stated that his appetite has not returned to normal yet.  He continues to endorse anxiety and depression but has been cooperative.  Pt's Goal today is to "be thankful - waking up with a positive mind."  A: Support, education, and encouragement provided as needed.  Ensure offered for nutritional supplementation.  Level 3 checks continued for safety.  R: Pt.  receptive to intervention/s.  Safety maintained.  Joaquin MusicMary Damir Leung, RN

## 2015-02-22 NOTE — H&P (Signed)
Psychiatric Admission Assessment Adult  Patient Identification: Brady Kim MRN:  [id number] Date of Evaluation:  02/22/2015 Chief Complaint:  ALCOHOL USE DISORDER,SEVERE MDD,SEVERE,SINGLE EPISODE,WITHOUT PSYCHOTIC FEATURES Principal Diagnosis: Severe major depression, single episode, without psychotic features ([place]) Diagnosis:   Patient Active Problem List   Diagnosis Date Noted  . Severe major depression, single episode, without psychotic features ([place]) [F32.2] 02/21/2015    Priority: High  . Alcohol use disorder, severe, dependence ([place]) [F10.20] 02/21/2015  . Right knee pain [M25.561] 06/27/2013  . Smoking [Z72.0] 06/27/2013  . Chronic low back pain [M54.5, G89.29] 03/16/2013   History of Present Illness: I have reviewed and concur with HPI below, modified as follows: Brady Kim is an 49 y.o. separated male who was brought into the [facility] via [facility]. Pt sts he took an OD of 8-59m meloxicam with a 12 pk of beer, some gin and smoked some crack cocaine about 11 pm in an attempt to kill himself. Pt sts that he later changed his mind and called 911. Pt sts that he still feels as if he might hurt himself. Pt sts that he has been in a state of decline over the last 4 months. Pt sts that about 4 months ago his job as a dGeophysicist/field seismologistwent from [employer], then he was fired about 2 weeks ago. Pt sts that he was arrested and charged with a felony with a firearm & simple assault. Pt gave no further details. Pt sts that after 9 years of marriage his wife asked him to move out. Pt denies HI, SHI and recent AVH. Pt sts that within the last 6 months, he has had AH, hearing voices talking to him. Pt sts that the voices he heard could possibly be substance induced. Pt has symptoms of depression including deep sadness, excessive guilt, lower self esteem, tearfulness, self isolating, lack of motivation for activities, irritability, feeling helpless and hopeless.   Pt sts that he is not on medication for  depression, does not have a psychiatrist or a therapist. Pt has been IP for MPrentissreasons once 13 years ago but has never seen a therapist. Pt sts he has not experienced physical, emotional/verbal or sexual abuse. Pt sts that he sleeps about 5 hours per night because he has trouble getting to sleep because his mind/thoughts are racing. Pt sts he has lost much of his appetite and has lost some weight in the last few months. Pt sts that he was sober for 13 years and then in June 2016, he began to drink alcohol again. His drinking led to crack cocaine use starting about 1 month ago.   Pt seen and chart reviewed on 02/22/15 for H&P: Pt is alert/oriented x4, calm, cooperative, and appropriate to situation. Pt reports that he has been very depressed lately secondary to financial strain, felony gun charges, drug use, and relationship strain with his wife. Pt also reports that he has to have all of his belongings out of his house by Monday morning or he will lose them as he is being evicted. Pt denies suicidal ideation today and is able to contract for safety. He reports feeling overwhelmed during his cocaine intoxication with his other stressors and that he is feeling much better now that he is no longer under the influence. Pt denies homicidal ideation and psychosis and does not appear to be responding to internal stimuli.   Pt reports that he has impending court dates in the next week or so due to a felony gun  charge as he is a felon from years ago who was caught with a gun. Pt reports that he will likely get probation and that "being here might help" his case. However, pt does appear to be genuine in his interest to take medications and treatment which can help his depression/anxiety and drug addictions.   Associated Signs/Symptoms: Depression Symptoms:  depressed mood, anhedonia, insomnia, psychomotor agitation, psychomotor retardation, fatigue, feelings of worthlessness/guilt, difficulty  concentrating, recurrent thoughts of death, suicidal thoughts with specific plan, suicidal attempt, anxiety, loss of energy/fatigue, (Hypo) Manic Symptoms:  Impulsivity, Irritable Mood, Labiality of Mood, Anxiety Symptoms:  Panic Symptoms, Psychotic Symptoms:  Denies PTSD Symptoms: NA Total Time spent with patient: 45 minutes  Past Psychiatric History: admissions x2 to [facility] and x1 to [facility] (pt reports this was decades ago).   Risk to Self: Is patient at risk for suicide?: Yes What has been your use of drugs/alcohol within the last 12 months?: alcohol use daily since March 2016-"around a 6pk daily." Crack cocaine "I don't do it every day. Mayabe twice a week I might do $40 worth."  Risk to Others:   Prior Inpatient Therapy:   Prior Outpatient Therapy:    Alcohol Screening: 1. How often do you have a drink containing alcohol?: 4 or more times a week 2. How many drinks containing alcohol do you have on a typical day when you are drinking?: 5 or 6 3. How often do you have six or more drinks on one occasion?: Never Preliminary Score: 2 4. How often during the last year have you found that you were not able to stop drinking once you had started?: Less than monthly 5. How often during the last year have you failed to do what was normally expected from you becasue of drinking?: Never 6. How often during the last year have you needed a first drink in the morning to get yourself going after a heavy drinking session?: Less than monthly 7. How often during the last year have you had a feeling of guilt of remorse after drinking?: Less than monthly 8. How often during the last year have you been unable to remember what happened the night before because you had been drinking?: Never 9. Have you or someone else been injured as a result of your drinking?: No 10. Has a relative or friend or a doctor or another health worker been concerned about your drinking or suggested you cut down?:  No Alcohol Use Disorder Identification Test Final Score (AUDIT): 9 Brief Intervention: Patient declined brief intervention Substance Abuse History in the last 12 months:  Yes.   Consequences of Substance Abuse: Mood destabilization Previous Psychotropic Medications: Yes  Psychological Evaluations: Yes  Past Medical History:  Past Medical History  Diagnosis Date  . Back pain   . Degenerative disc disease     Past Surgical History  Procedure Laterality Date  . Knee arthroscopy    . Ankle fusion     Family History:  Family History  Problem Relation Age of Onset  . Hypertension Mother   . Diabetes Maternal Aunt   . Cancer Maternal Aunt   . Cancer Maternal Grandmother    Family Psychiatric  History: Depression in family Social History:  History  Alcohol Use  . Yes    Comment: Occasional     History  Drug Use No    Social History   Social History  . Marital Status: Married    Spouse Name: N/A  . Number of Children:  N/A  . Years of Education: N/A   Social History Main Topics  . Smoking status: Current Every Day Smoker -- 0.50 packs/day    Types: Cigarettes  . Smokeless tobacco: None  . Alcohol Use: Yes     Comment: Occasional  . Drug Use: No  . Sexual Activity: Not Asked   Other Topics Concern  . None   Social History Narrative   Additional Social History:    Pain Medications: None reported Prescriptions: None reported Over the Counter: None History of alcohol / drug use?: Yes Longest period of sobriety (when/how long): 13 years- started again in June, 2016 Negative Consequences of Use: Financial, Scientist, research (physical sciences), Personal relationships, Work / Youth worker Withdrawal Symptoms: Other (Comment) (None "right now") Name of Substance 1: Alcohol 1 - Age of First Use: 15 1 - Amount (size/oz): 3-4 40 oz beers 1 - Frequency: daily 1 - Duration: 4 months 1 - Last Use / Amount: yesterday Name of Substance 2: Crack cocaine 2 - Age of First Use: 20 2 - Amount (size/oz):  "don't know" 2 - Frequency: every other day 2 - Duration: 1 month 2 - Last Use / Amount: yesterday Name of Substance 3: Nicotine 3 - Age of First Use: 15 3 - Amount (size/oz): 1/2 pack 3 - Frequency: daily 3 - Duration: since teens 3 - Last Use / Amount: yesterday              Allergies:  No Known Allergies Lab Results:  Results for orders placed or performed during the hospital encounter of 02/21/15 (from the past 48 hour(s))  Urine rapid drug screen (hosp performed) (Not at [facility])     Status: Abnormal   Collection Time: 02/21/15  1:03 AM  Result Value Ref Range   Opiates NONE DETECTED NONE DETECTED   Cocaine POSITIVE (A) NONE DETECTED   Benzodiazepines NONE DETECTED NONE DETECTED   Amphetamines NONE DETECTED NONE DETECTED   Tetrahydrocannabinol NONE DETECTED NONE DETECTED   Barbiturates NONE DETECTED NONE DETECTED    Comment:        DRUG SCREEN FOR MEDICAL PURPOSES ONLY.  IF CONFIRMATION IS NEEDED FOR ANY PURPOSE, NOTIFY LAB WITHIN 5 DAYS.        LOWEST DETECTABLE LIMITS FOR URINE DRUG SCREEN Drug Class       Cutoff (ng/mL) Amphetamine      1000 Barbiturate      200 Benzodiazepine   196 Tricyclics       222 Opiates          300 Cocaine          300 THC              50   Comprehensive metabolic panel     Status: Abnormal   Collection Time: 02/21/15  1:08 AM  Result Value Ref Range   Sodium 137 135 - 145 mmol/L   Potassium 3.5 3.5 - 5.1 mmol/L   Chloride 103 101 - 111 mmol/L   CO2 25 22 - 32 mmol/L   Glucose, Bld 89 65 - 99 mg/dL   BUN 8 6 - 20 mg/dL   Creatinine, Ser 0.88 0.61 - 1.24 mg/dL   Calcium 8.8 (L) 8.9 - 10.3 mg/dL   Total Protein 6.9 6.5 - 8.1 g/dL   Albumin 4.1 3.5 - 5.0 g/dL   AST 12 (L) 15 - 41 U/L   ALT 18 17 - 63 U/L   Alkaline Phosphatase 66 38 - 126 U/L   Total Bilirubin 0.4 0.3 -  1.2 mg/dL    Comment: REPEATED TO VERIFY   GFR calc non Af Amer >60 >60 mL/min   GFR calc Af Amer >60 >60 mL/min    Comment: (NOTE) The eGFR has been  calculated using the CKD EPI equation. This calculation has not been validated in all clinical situations. eGFR's persistently <60 mL/min signify possible Chronic Kidney Disease.    Anion gap 9 5 - 15  Ethanol (ETOH)     Status: Abnormal   Collection Time: 02/21/15  1:08 AM  Result Value Ref Range   Alcohol, Ethyl (B) 143 (H) <5 mg/dL    Comment:        LOWEST DETECTABLE LIMIT FOR SERUM ALCOHOL IS 5 mg/dL FOR MEDICAL PURPOSES ONLY   Salicylate level     Status: None   Collection Time: 02/21/15  1:08 AM  Result Value Ref Range   Salicylate Lvl <3.4 2.8 - 30.0 mg/dL  Acetaminophen level     Status: Abnormal   Collection Time: 02/21/15  1:08 AM  Result Value Ref Range   Acetaminophen (Tylenol), Serum <10 (L) 10 - 30 ug/mL    Comment:        THERAPEUTIC CONCENTRATIONS VARY SIGNIFICANTLY. A RANGE OF 10-30 ug/mL MAY BE AN EFFECTIVE CONCENTRATION FOR MANY PATIENTS. HOWEVER, SOME ARE BEST TREATED AT CONCENTRATIONS OUTSIDE THIS RANGE. ACETAMINOPHEN CONCENTRATIONS >150 ug/mL AT 4 HOURS AFTER INGESTION AND >50 ug/mL AT 12 HOURS AFTER INGESTION ARE OFTEN ASSOCIATED WITH TOXIC REACTIONS.   CBC     Status: None   Collection Time: 02/21/15  1:08 AM  Result Value Ref Range   WBC 8.3 4.0 - 10.5 K/uL   RBC 4.65 4.22 - 5.81 MIL/uL   Hemoglobin 14.7 13.0 - 17.0 g/dL   HCT 43.1 39.0 - 52.0 %   MCV 92.7 78.0 - 100.0 fL   MCH 31.6 26.0 - 34.0 pg   MCHC 34.1 30.0 - 36.0 g/dL   RDW 13.5 11.5 - 15.5 %   Platelets 248 150 - 400 K/uL  Troponin I     Status: None   Collection Time: 02/21/15  1:08 AM  Result Value Ref Range   Troponin I <0.03 <0.031 ng/mL    Comment:        NO INDICATION OF MYOCARDIAL INJURY.   CBG monitoring, ED     Status: None   Collection Time: 02/21/15  1:32 AM  Result Value Ref Range   Glucose-Capillary 77 65 - 99 mg/dL  Troponin I     Status: None   Collection Time: 02/21/15  3:52 AM  Result Value Ref Range   Troponin I <0.03 <0.031 ng/mL    Comment:         NO INDICATION OF MYOCARDIAL INJURY.     Metabolic Disorder Labs:  Lab Results  Component Value Date   HGBA1C 5.80 08/24/2014   MPG 128* 01/10/2014   MPG 123* 03/14/2013   No results found for: PROLACTIN Lab Results  Component Value Date   CHOL 101 03/14/2013   TRIG 102 03/14/2013   HDL 46 03/14/2013   CHOLHDL 2.2 03/14/2013   VLDL 20 03/14/2013   LDLCALC 35 03/14/2013    Current Medications: Current Facility-Administered Medications  Medication Dose Route Frequency Provider Last Rate Last Dose  . acetaminophen (TYLENOL) tablet 650 mg  650 mg Oral Q4H PRN Patrecia Pour, NP      . acetaminophen (TYLENOL) tablet 650 mg  650 mg Oral Q6H PRN Patrecia Pour, NP  650 mg at 02/21/15 2149  . alum & mag hydroxide-simeth (MAALOX/MYLANTA) 200-200-20 MG/5ML suspension 30 mL  30 mL Oral PRN Patrecia Pour, NP      . alum & mag hydroxide-simeth (MAALOX/MYLANTA) 200-200-20 MG/5ML suspension 30 mL  30 mL Oral Q4H PRN Patrecia Pour, NP      . feeding supplement (ENSURE ENLIVE) (ENSURE ENLIVE) liquid 237 mL  237 mL Oral BID BM Nicholaus Bloom, MD   237 mL at 02/22/15 0845  . FLUoxetine (PROZAC) capsule 10 mg  10 mg Oral Daily Patrecia Pour, NP   10 mg at 02/22/15 9937  . LORazepam (ATIVAN) tablet 0-4 mg  0-4 mg Oral 4 times per day Patrecia Pour, NP   1 mg at 02/22/15 0849   Followed by  . [START ON 02/23/2015] LORazepam (ATIVAN) tablet 0-4 mg  0-4 mg Oral Q12H Patrecia Pour, NP      . magnesium hydroxide (MILK OF MAGNESIA) suspension 30 mL  30 mL Oral Daily PRN Patrecia Pour, NP      . nicotine (NICODERM CQ - dosed in mg/24 hours) patch 21 mg  21 mg Transdermal Daily Patrecia Pour, NP   21 mg at 02/22/15 1696  . ondansetron (ZOFRAN) tablet 4 mg  4 mg Oral Q8H PRN Patrecia Pour, NP      . pantoprazole (PROTONIX) EC tablet 20 mg  20 mg Oral Daily Patrecia Pour, NP   20 mg at 02/22/15 0826  . thiamine (VITAMIN B-1) tablet 100 mg  100 mg Oral Daily Patrecia Pour, NP   0 mg at 02/22/15  7893   Or  . thiamine (B-1) injection 100 mg  100 mg Intravenous Daily Patrecia Pour, NP       PTA Medications: Prescriptions prior to admission  Medication Sig Dispense Refill Last Dose  . amoxicillin-clavulanate (AUGMENTIN) 875-125 MG per tablet Take 1 tablet by mouth every 12 (twelve) hours. (Patient not taking: Reported on 02/21/2015) 14 tablet 0 Completed Course at Unknown time  . butalbital-acetaminophen-caffeine (ESGIC) 50-325-40 MG per tablet Take 1 tablet by mouth every 6 (six) hours as needed for headache. (Patient not taking: Reported on 09/20/2014) 30 tablet 0 Not Taking at Unknown time  . carisoprodol (SOMA) 350 MG tablet Take 1 tablet (350 mg total) by mouth at bedtime. (Patient not taking: Reported on 09/20/2014) 30 tablet 0 Not Taking at Unknown time  . famotidine (PEPCID) 20 MG tablet Take 1 tablet (20 mg total) by mouth 2 (two) times daily. (Patient not taking: Reported on 09/20/2014) 60 tablet 6 Not Taking at Unknown time  . HYDROcodone-acetaminophen (NORCO/VICODIN) 5-325 MG per tablet Take 1 tablet by mouth every 4 (four) hours as needed for moderate pain. (Patient not taking: Reported on 02/21/2015) 12 tablet 0 Completed Course at Unknown time  . ibuprofen (ADVIL,MOTRIN) 200 MG tablet Take 200-400 mg by mouth every 6 (six) hours as needed for mild pain or moderate pain.   Past Month at Unknown time  . ibuprofen (ADVIL,MOTRIN) 800 MG tablet Take 1 tablet (800 mg total) by mouth 3 (three) times daily. (Patient not taking: Reported on 09/20/2014) 21 tablet 0 Completed Course at Unknown time  . meloxicam (MOBIC) 15 MG tablet Take 120 mg by mouth once.   02/21/2015 at Unknown time  . naproxen (NAPROSYN) 500 MG tablet Take 1 tablet (500 mg total) by mouth 2 (two) times daily with a meal. (Patient not taking: Reported on 09/20/2014) 30 tablet  6 Completed Course at Unknown time  . nicotine (NICODERM CQ) 21 mg/24hr patch Place 1 patch (21 mg total) onto the skin daily. (Patient not taking:  Reported on 09/20/2014) 28 patch 0 Not Taking at Unknown time  . predniSONE (STERAPRED UNI-PAK) 10 MG tablet 6 tabs po day 1, 5 tabs po day 2, 4 tabs po day 3, 3 tabs po day 4, 2 tabs po day 5, 1 tab po day 6 (Patient not taking: Reported on 09/20/2014) 21 tablet 0 Completed Course at Unknown time  . traMADol (ULTRAM) 50 MG tablet Take 1 tablet (50 mg total) by mouth every 6 (six) hours as needed. (Patient not taking: Reported on 09/20/2014) 90 tablet 3 Completed Course at Unknown time    Musculoskeletal: Strength & Muscle Tone: within normal limits Gait & Station: normal Patient leans: N/A  Psychiatric Specialty Exam: Physical Exam  HENT:  Mouth/Throat: Oropharyngeal exudate present.    Review of Systems  Psychiatric/Behavioral: Positive for depression and substance abuse. Negative for suicidal ideas. The patient is nervous/anxious and has insomnia.   All other systems reviewed and are negative.   Blood pressure 110/78, pulse 72, temperature 99 F (37.2 C), temperature source Oral, resp. rate 16, height 5' 11"  (1.803 m), weight 63.957 kg (141 lb).Body mass index is 19.67 kg/(m^2).  General Appearance: Casual and Fairly Groomed  Eye Contact::  Good  Speech:  Clear and Coherent and Normal Rate  Volume:  Normal  Mood:  Anxious and Depressed  Affect:  Appropriate, Congruent and Depressed  Thought Process:  Circumstantial and Coherent  Orientation:  Full (Time, Place, and Person)  Thought Content:  WDL  Suicidal Thoughts:  No  Homicidal Thoughts:  No  Memory:  Immediate;   Fair Recent;   Fair Remote;   Fair  Judgement:  Fair  Insight:  Fair  Psychomotor Activity:  Normal  Concentration:  Fair  Recall:  [employer] [place]  Language: Fair  Akathisia:  No  Handed:    AIMS (if indicated):     Assets:  Communication Skills Desire for Improvement Physical Health Resilience Social Support  ADL's:  Intact  Cognition: WNL  Sleep:      Treatment Plan Summary: Daily  contact with patient to assess and evaluate symptoms and progress in treatment and Medication management  Medications:  -Continue Prozac 87m daily for MDD -Start Buspar 7.584mbid for anxiety -Start Trazodone 2543mhs prn insomnia  Labs:  -Reviewed all labs, pertinent for BAL 143, UDS + for cocaine; all else unremarkable  Observation Level/Precautions:  15 minute checks  Laboratory:  Labs resulted, reviewed, and stable at this time.   Psychotherapy:  Group therapy, individual therapy, psychoeducation  Medications:  See MAR above  Consultations: None    Discharge Concerns: None    Estimated LOS: 5-7 days  Other:  N/A    I certify that inpatient services furnished can reasonably be expected to improve the patient's condition.    [place], JohElyse JarvisNP-BC 10/28/201611:10 AM Case reviewed with NP and patient seen by me Agree with NP assessment and note CLINICAL FACTORS:  [age]ar old male presented to ED reporting suicidal attempt by overdosing on 8 meloxicams, alcohol, cocaine . States he had consumed a large number of beers and also some liquor . Reports he has been depressed due to worsening psychosocial stressors, which include recent separation, recent loss of job,legal difficulties, charges . Patient is not currently presenting with any significant withdrawal symptoms- no tremors , no  diaphoresis, no psychomotor restlessness or agitation. States he is feeling better now that he is off effects of alcohol and cocaine . Denies any current Suicidal ideation. At this time he is focused on being discharged soon as he needs to move furniture, belongings out of his apartment before being eviction, losing them. Dx- Substance Induced Mood Disorder , Depressed  Plan- inpatient psychiatric admission- start PROZAC 10 mgrs Daily to address depression. ATIVAN PRNS for possible symptoms of alcohol WDL. Consider discharge soon if patient continues to stabilize .

## 2015-02-22 NOTE — [facility] Group Notes (Signed)
[facility]   02/22/2015 9:44 AM  Participation Quality:  Appropriate   Mood/Affect:  Depressed and Lethargic  Depression Rating:  3  Anxiety Rating:  "normal"   Thoughts of Suicide:  No Will you contract for safety?   NA  Current AVH:  No  Plan for Discharge/Comments:  Pt wants to d/c by Sunday. "I'm in the process of moving." Pt states that he has no providers and needs help with "depression." Pt also mentioned crack cocaine and ETOH abuse. Pt reports good sleep last night.   Transportation Means: unknown at this time/bus pass?  Supports: none identified by pt.   Smart, American FinancialHeather LCSWA

## 2015-02-22 NOTE — Progress Notes (Signed)
  [facility]  Will you be returning to the same living situation after discharge:  Yes,  home with parents At discharge, do you have transportation home?: Yes,  pt's father will pick him up after 1:30PM on Sunday.  Do you have the ability to pay for your medications: Yes,  mental health  Release of information consent forms completed and submitted to medical records by CSW.  Patient to Follow up at: Follow-up Information    [facility] On [facility]/26/2015.   Why:  Arrive between 9am-12pm on this date for assessment (medication management/Counseling)   Contact information:   [address] [place], [place] [address] Phone: [phone] Fax: [phone]      Patient denies SI/HI: Yes,  during group/self report.     Safety Planning and Suicide Prevention discussed: Yes,  SPE completed with pt's father> SPI pamphlet provided to pt and he was encouraged to share information with support network, ask questions, and talk about any concerns relating to SPE.  Have you used any form of tobacco in the last 30 days? (Cigarettes, Smokeless Tobacco, Cigars, and/or Pipes): Yes  Has patient been referred to the Quitline?: Patient refused referral  Smart, Lebron QuamHeather LCSWA  02/22/2015, 2:29 PM

## 2015-02-22 NOTE — Progress Notes (Signed)
D. Pt had been up and visible in milieu this evening, did attend and participate in evening group activity. Pt did endorse some anxiety this evening and received medications accordingly. Pt was seen interacting appropriately with peers throughout the evening. A. Support and encouragement provided, medication education provided. R. Pt verbalized understanding, safety maintained.

## 2015-02-22 NOTE — Progress Notes (Signed)
D.  Pt pleasant on approach, denies complaints at this time.  Positive for evening AA group, interacting appropriately with peers on the unit.  Denies SI/HI/hallucinations at this time.  A.  Support and encouragement offered  R.  Pt remains safe on the unit, will continue to monitor.   

## 2015-02-23 NOTE — [facility] Group Notes (Signed)
[facility] Group Notes:  (Nursing/MHT/Case Management/Adjunct)  Date:  02/23/2015  Time:  3:56 PM  Type of Therapy:  Psychoeducational Skills  Participation Level:  Did Not Attend  Participation Quality:  Did Not Attend  Affect:  Did Not Attend  Cognitive:  Did Not Attend  Insight:  None  Engagement in Group:  Did Not Attend  Modes of Intervention:  Did Not Attend  Summary of Progress/Problems: Pt did not attend patient self inventory group.  Zerenity Bowron Shanta 02/23/2015, 3:56 PM 

## 2015-02-23 NOTE — Progress Notes (Signed)
D.  Pt pleasant on approach, denies complaints at this time.  Positive for evening wrap up group, interacting appropriately with peers on the unit.  Denies SI/HI/hallucinations at this time.  A.  Support and encouragement offered  R.  Pt remains safe on the unit, will continue to monitor.  

## 2015-02-23 NOTE — Progress Notes (Signed)
[facility] Group Notes:  (Nursing/MHT/Case Management/Adjunct)  Date:  02/23/2015  Time:  11:38 PM  Type of Therapy:  Psychoeducational Skills  Participation Level:  Active  Participation Quality:  Appropriate  Affect:  Appropriate  Cognitive:  Appropriate  Insight:  Appropriate  Engagement in Group:  Engaged  Modes of Intervention:  Education  Summary of Progress/Problems: The patient had little to share about his day except that he had a good day since his wife came in to visit. As a theme for the day, his coping skills will include the following: try to think more positively and limit his own drinking. He would also like to work on his marriage.   Hazle CocaGOODMAN, Cavan Bearden S 02/23/2015, 11:38 PM

## 2015-02-23 NOTE — [facility] Group Notes (Signed)
[facility] Group Notes:  (Clinical Social Work)   02/23/2015     10:00-11:00AM  Summary of Progress/Problems:   In today's process group a decisional balance exercise was used to explore in depth the perceived benefits and costs of alcohol and drugs, as well as the  benefits and costs of replacing these with healthy coping skills.  Patients listed healthy and unhealthy coping techniques, determining with CSW guidance that unhealthy coping techniques work initially, but eventually become harmful.  Motivational Interviewing and the whiteboard were utilized for the exercises.  The patient expressed that the unhealthy coping he often uses is crack cocaine, and talked about his 13 years of sobriety and what led to his relapse.  He was insightful and shared a great deal, provided others with feedback and support.  Type of Therapy:  Group Therapy - Process   Participation Level:  Active  Participation Quality:  Appropriate, Attentive, Sharing and Supportive  Affect:  Blunted  Cognitive:  Alert, Appropriate and Oriented  Insight:  Engaged  Engagement in Therapy:  Engaged  Modes of Intervention:  Education, Motivational Interviewing  Ambrose MantleMareida Grossman-Orr, LCSW 02/23/2015, 12:46 PM

## 2015-02-23 NOTE — Progress Notes (Signed)
[facility]  02/23/2015 12:21 PM Brady SledgeRonnie Kim  MRN:  [id number] Subjective:  I am doing better, more clear and less depressed  HPI: Brady Kim is an 49 y.o. separated male [facility]. Initially presented as follows as per record " Pt sts he took an OD of 8-15mg  meloxicam with a 12 pk of beer, some gin and smoked some crack cocaine about 11 pm in an attempt to kill himself. Pt sts that he later changed his mind and called 911. Pt sts that he still feels as if he might hurt himself. Pt sts that he has been in a state of decline over the last 4 months. 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 Pt sts that he was arrested and charged with a felony with a firearm & simple assault. Pt sts that the voices he heard could possibly be substance induced. Pt has symptoms of depression including deep sadness, excessive guilt, lower self esteem, tearfulness, self isolating, lack of motivation for activities, irritability, feeling helpless and hopeless"  Today feeling less depressed, tolerating detox and adequate sleep. No withdrawals symptoms.  Denies psychosis. He is engaged in groups and therapy   Principal Problem: Substance induced mood disorder (HCC) Diagnosis:   Patient Active Problem List   Diagnosis Date Noted  . Substance induced mood disorder (HCC) [F19.94]   . Alcohol use disorder, severe, dependence (HCC) [F10.20] 02/21/2015  . 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 Right knee pain [M25.561] 06/27/2013  . Smoking [Z72.0] 06/27/2013  . Chronic low back pain [M54.5, G89.29] 03/16/2013   Total Time spent with patient: 30 minutes  Past Psychiatric History: [place]  Past Medical History:  Past Medical History  Diagnosis Date  . Back pain   . Degenerative disc disease     Past Surgical History   Procedure Laterality Date  . Knee arthroscopy    . Ankle fusion     Family History:  Family History  Problem Relation Age of Onset  . Hypertension Mother   . Diabetes Maternal Aunt   . Cancer Maternal Aunt   . Cancer Maternal Grandmother    Family Psychiatric  History: depression Social History:  History  Alcohol Use  . Yes    Comment: Occasional     History  Drug Use No    Social History   Social History  . Marital Status: Married    Spouse Name: N/A  . Number of Children: N/A  . Years of Education: N/A   Social History Main Topics  . Smoking status: Current Every Day Smoker -- 0.50 packs/day    Types: Cigarettes  . Smokeless tobacco: None  . Alcohol Use: Yes     Comment: Occasional  . Drug Use: No  . Sexual Activity: Not Asked   Other Topics Concern  . None   Social History Narrative   Additional Social History:    Pain Medications: None reported Prescriptions: None reported Over the Counter: None History of alcohol / drug use?: Yes Longest period of sobriety (when/how long): 13 years- started again in June, 2016 Negative Consequences of Use: Financial, Armed forces operational officerLegal, Personal relationships, Work / Programmer, multimediachool Withdrawal Symptoms: Other (Comment) (None "right now") Name of Substance 1: Alcohol 1 - Age of First Use: 15 1 - Amount (size/oz): 3-4 40 oz beers  1 - Frequency: daily 1 - Duration: 4 months 1 - Last Use / Amount: yesterday Name of Substance 2: Crack cocaine 2 - Age of First Use: 20 2 - Amount (size/oz): "don't know" 2 - Frequency: every other day 2 - Duration: 1 month 2 - Last Use / Amount: yesterday Name of Substance 3: Nicotine 3 - Age of First Use: 15 3 - Amount (size/oz): 1/2 pack 3 - Frequency: daily 3 - Duration: since teens 3 - Last Use / Amount: yesterday              Sleep: Fair  Appetite:  Fair  Current Medications: Current Facility-Administered Medications  Medication Dose Route Frequency Provider Last Rate Last Dose  .  acetaminophen (TYLENOL) tablet 650 mg  650 mg Oral Q6H PRN Charm Rings, NP   650 mg at 02/21/15 2149  . alum & mag hydroxide-simeth (MAALOX/MYLANTA) 200-200-20 MG/5ML suspension 30 mL  30 mL Oral Q4H PRN Charm Rings, NP      . feeding supplement (ENSURE ENLIVE) (ENSURE ENLIVE) liquid 237 mL  237 mL Oral BID BM Rachael Fee, MD   237 mL at 02/23/15 0908  . FLUoxetine (PROZAC) capsule 10 mg  10 mg Oral Daily Charm Rings, NP   10 mg at 02/23/15 0906  . LORazepam (ATIVAN) tablet 0-4 mg  0-4 mg Oral Q12H Charm Rings, NP   2 mg at 02/23/15 0905  . magnesium hydroxide (MILK OF MAGNESIA) suspension 30 mL  30 mL Oral Daily PRN Charm Rings, NP      . nicotine (NICODERM CQ - dosed in mg/24 hours) patch 21 mg  21 mg Transdermal Daily Charm Rings, NP   21 mg at 02/23/15 0906  . ondansetron (ZOFRAN) tablet 4 mg  4 mg Oral Q8H PRN Charm Rings, NP      . pantoprazole (PROTONIX) EC tablet 20 mg  20 mg Oral Daily Charm Rings, NP   20 mg at 02/23/15 0906  . thiamine (VITAMIN B-1) tablet 100 mg  100 mg Oral Daily Charm Rings, NP   100 mg at 02/23/15 1610   Or  . thiamine (B-1) injection 100 mg  100 mg Intravenous Daily Charm Rings, NP        Lab Results: No results found for this or any previous visit (from the past 48 hour(s)).  Physical Findings: AIMS: Facial and Oral Movements Muscles of Facial Expression: None, normal Lips and Perioral Area: None, normal Jaw: None, normal Tongue: None, normal,Extremity Movements Upper (arms, wrists, hands, fingers): None, normal Lower (legs, knees, ankles, toes): None, normal, Trunk Movements Neck, shoulders, hips: None, normal, Overall Severity Severity of abnormal movements (highest score from questions above): None, normal Incapacitation due to abnormal movements: None, normal Patient's awareness of abnormal movements (rate only patient's report): No Awareness, Dental Status Current problems with teeth and/or dentures?: No Does patient  usually wear dentures?: No  CIWA:  CIWA-Ar Total: 0 COWS:     Musculoskeletal: Strength & Muscle Tone: within normal limits Gait & Station: normal Patient leans: N/A  Psychiatric Specialty Exam: Review of Systems  Constitutional: Negative for fever.  Cardiovascular: Negative for chest pain.  Skin: Negative for rash.  Neurological: Positive for tremors.  Psychiatric/Behavioral: Positive for depression. The patient is nervous/anxious.     Blood pressure 120/88, pulse 82, temperature 98.6 F (37 C), temperature source Oral, resp. rate 16, height  (1.803 m), weight 63.957 kg (141 lb).Body mass  index is 19.67 kg/(m^2).  General Appearance: Casual  Eye Contact::  Fair  Speech:  Slow  Volume:  Decreased  Mood:  Dysphoric  Affect:  Congruent  Thought Process:  Coherent  Orientation:  Full (Time, Place, and Person)  Thought Content:  Rumination  Suicidal Thoughts:  No  Homicidal Thoughts:  No  Memory:  Immediate;   Fair Recent;   Fair  Judgement:  Fair  Insight:  Shallow  Psychomotor Activity:  Decreased  Concentration:  Fair  Recall:  [employer] of Knowledge:Fair  Language: Fair  Akathisia:  Negative  Handed:  Right  AIMS (if indicated):     Assets:  Desire for Improvement Social Support Transportation  ADL's:  Intact  Cognition: WNL  Sleep:  Number of Hours: 6.25   Treatment Plan Summary: Daily contact with patient to assess and evaluate symptoms and progress in treatment, Medication management and Plan as follows   Depression: Continue prozac Alcohol use disorder; Continue detox protocol. Monitor sleep and wihtdrawal symptoms Attending groups and supportive therapy.  Lige Lakeman 02/23/2015, 12:21 PM

## 2015-02-23 NOTE — Progress Notes (Signed)
Patient ID: Brady Kim, male   DOB: 11/22/1965, 49 y.o.   MRN: [id number]   D: Pt has been very flat and depressed on the unit today. Pt did not attend any groups nor did he engage in any treatment. Pt slept most of the day, he only got up for meds and food. Pt reported that his depression was a 0, his hopelessness was a 0, and his anxiety was a 2. Pt reported that his goal for today was to focus on looking forward and staying clean and sober. Pt reported being negative SI/HI, no AH/VH noted. A: 15 min checks continued for patient safety. R: Pt safety maintained.

## 2015-02-23 NOTE — Plan of Care (Signed)
Problem: Diagnosis: Increased Risk For Suicide Attempt Goal: STG-Patient Will Attend All Groups On The Unit Outcome: Progressing Pt has attended groups this weekend

## 2015-02-24 MED ORDER — FLUOXETINE HCL 10 MG PO CAPS: 10.0000 mg | ORAL_CAPSULE | Freq: Every day | ORAL | Status: DC

## 2015-02-24 MED ORDER — PANTOPRAZOLE SODIUM 20 MG PO TBEC: 20.0000 mg | DELAYED_RELEASE_TABLET | Freq: Every day | ORAL | Status: DC

## 2015-02-24 NOTE — [facility] Group Notes (Signed)
[facility] Group Notes:  (Nursing/MHT/Case Management/Adjunct)  Date:  02/24/2015  Time:  1000  Type of Therapy:  Nurse Education - Healthy Support Systems  Participation Level:  Active  Participation Quality:  Attentive  Affect:  Appropriate  Cognitive:  Appropriate  Insight:  Good  Engagement in Group:  Engaged  Modes of Intervention:  Discussion and Education  Summary of Progress/Problems: Patient was engaged in group and shared his healthy support system is his work.  Merian CapronFriedman, Kaiven Vester [facility] 02/24/2015, [phone]

## 2015-02-24 NOTE — Discharge Summary (Addendum)
Physician Discharge Summary Note  Patient:  Brady Kim is an 49 y.o., male MRN:  [id number] DOB:  1965-12-10 Patient phone:  [phone] (home)  Patient address:   [address] [place] [address],  Total Time spent with patient: 30 minutes  Date of Admission:  02/21/2015 Date of Discharge: 02/24/2015  Reason for Admission:Per HPIRonnie Kim is an 49 y.o. separated male who was [facility] the [facility] via [facility]. Pt sts he took an OD of 8-15mg  meloxicam with a 12 pk of beer, some gin and smoked some crack cocaine about 11 pm in an attempt to kill himself. Pt sts that he later changed his mind and called 911. Pt sts that he still feels as if he might hurt himself. Pt sts that he has been in a state of decline over the last 4 months. 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 Pt sts that he was arrested and charged with a felony with a firearm & simple assault. Pt gave no further details. Pt sts that after 9 years of marriage his wife asked him to move out. Pt denies HI, SHI and recent AVH. Pt sts that within the last 6 months, he has had AH, hearing voices talking to him. Pt sts that the voices he heard could possibly be substance induced. Pt has symptoms of depression including deep sadness, excessive guilt, lower self esteem, tearfulness, self isolating, lack of motivation for activities, irritability, feeling helpless and hopeless.   Pt sts that he is not on medication for depression, does not have a psychiatrist or a therapist. Pt has been IP for MH reasons once 13 years ago but has never seen a therapist. Pt sts he has not experienced physical, emotional/verbal or sexual abuse. Pt sts that he sleeps about 5 hours per night because he has trouble getting to sleep because his mind/thoughts are racing. Pt sts he has lost much of his appetite and has lost some weight in the last few months. Pt sts that he was sober for 13  years and then in June 2016, he began to drink alcohol again. His drinking led to crack cocaine use starting about 1 month ago  Principal Problem: Substance induced mood disorder Brady Kim) Discharge Diagnoses: Patient Active Problem List   Diagnosis Date Noted  . Substance induced mood disorder (HCC) [F19.94]   . Alcohol use disorder, severe, dependence (HCC) [F10.20] 02/21/2015  . 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 Right knee pain [M25.561] 06/27/2013  . Smoking [Z72.0] 06/27/2013  . Chronic low back pain [M54.5, G89.29] 03/16/2013    Musculoskeletal: Strength & Muscle Tone: within normal limits Gait & Station: normal Patient leans: N/A  Psychiatric Specialty Exam: SEE SRA Physical Exam  Nursing note and vitals reviewed. Constitutional: He is oriented to person, place, and time. He appears well-developed and well-nourished.  HENT:  Head: Normocephalic and atraumatic.  Neck: Normal range of motion.  Respiratory: Breath sounds normal.  Musculoskeletal: Normal range of motion.  Neurological: He is alert and oriented to person, place, and time.  Skin: Skin is warm and dry.    Review of Systems  Psychiatric/Behavioral: Negative for suicidal ideas. Depression: stable. Substance abuse: stable. Nervous/anxious: stable.   All other systems reviewed and are negative.   Blood pressure 115/90, pulse 80, temperature 97.8 F (36.6 C), temperature source Oral, resp. rate 16, height 5'  11" (1.803 m), weight 63.957 kg (141 lb).Body mass index is 19.67 kg/(m^2).  Have you used any form of tobacco in the last 30 days? (Cigarettes, Smokeless Tobacco, Cigars, and/or Pipes): Yes  Has this patient used any form of tobacco in the last 30 days? (Cigarettes, Smokeless Tobacco, Cigars, and/or Pipes) Yes, A prescription for an FDA-approved tobacco cessation medication was offered at discharge and the patient refused  Past Medical History:  Past  Medical History  Diagnosis Date  . Back pain   . Degenerative disc disease     Past Surgical History  Procedure Laterality Date  . Knee arthroscopy    . Ankle fusion     Family History:  Family History  Problem Relation Age of Onset  . Hypertension Mother   . Diabetes Maternal Aunt   . Cancer Maternal Aunt   . Cancer Maternal Grandmother    Social History:  History  Alcohol Use  . Yes    Comment: Occasional     History  Drug Use No    Social History   Social History  . Marital Status: Married    Spouse Name: N/A  . Number of Children: N/A  . Years of Education: N/A   Social History Main Topics  . Smoking status: Current Every Day Smoker -- 0.50 packs/day    Types: Cigarettes  . Smokeless tobacco: None  . Alcohol Use: Yes     Comment: Occasional  . Drug Use: No  . Sexual Activity: Not Asked   Other Topics Concern  . None   Social History Narrative    Risk to Self: Is patient at risk for suicide?: Yes What has been your use of drugs/alcohol within the last 12 months?: alcohol use daily since March 2016-"around a 6pk daily." Crack cocaine "I don't do it every day. Mayabe twice a week I might do $40 worth."  Risk to Others:  NO Prior Inpatient Therapy:  YES Prior Outpatient Therapy:  YES  Level of Care:  OP  Kim Course:Brady Kim was admitted for Substance induced mood disorder (HCC) and crisis management.He was treated discharged with the medications listed below under Medication List.  Medical problems were identified and treated as needed.  Home medications were restarted as appropriate.  Improvement was monitored by observation and Lourdes Sledge daily report of symptom reduction.  Emotional and mental status was monitored by daily self-inventory reports completed by Lourdes Sledge and clinical staff.         Brady Kim was evaluated by the treatment team for stability and plans for continued recovery upon discharge.  Brady Kim motivation was an  integral factor for scheduling further treatment.  Employment, transportation, bed availability, health status, family support, and any pending legal issues were also considered during his Kim stay.He was offered further treatment options upon discharge including but not limited to Residential, Intensive Outpatient, and Outpatient treatment.  Brady Kim will follow up with the services as listed below under Follow Up Information.     Upon completion of this admission the patient was both mentally and medically stable for discharge denying suicidal/homicidal ideation, auditory/visual/tactile hallucinations, delusional thoughts and paranoia.      Consults:  psychiatry  Significant Diagnostic Studies:  labs: CBC WNL, CMP;calcium 8.8, AST12, [place] 143, Aceteminophen leve <10 posititve Cocaine  Discharge Vitals:   Blood pressure 115/90, pulse 80, temperature 97.8 F (36.6 C), temperature source Oral, resp. rate 16, height  (1.803 m), weight 63.957 kg (141 lb). Body mass index is  19.67 kg/(m^2). Lab Results:   No results found for this or any previous visit (from the past 72 hour(s)).  Physical Findings: AIMS: Facial and Oral Movements Muscles of Facial Expression: None, normal Lips and Perioral Area: None, normal Jaw: None, normal Tongue: None, normal,Extremity Movements Upper (arms, wrists, hands, fingers): None, normal Lower (legs, knees, ankles, toes): None, normal, Trunk Movements Neck, shoulders, hips: None, normal, Overall Severity Severity of abnormal movements (highest score from questions above): None, normal Incapacitation due to abnormal movements: None, normal Patient's awareness of abnormal movements (rate only patient's report): No Awareness, Dental Status Current problems with teeth and/or dentures?: No Does patient usually wear dentures?: No  CIWA:  CIWA-Ar Total: 0 COWS:      See Psychiatric Specialty Exam and Suicide Risk Assessment completed by Attending  Physician prior to discharge.  Discharge destination:  Home  Is patient on multiple antipsychotic therapies at discharge:  No   Has Patient had three or more failed trials of antipsychotic monotherapy by history:  No    Recommended Plan for Multiple Antipsychotic Therapies: NA      Discharge Instructions    Activity as tolerated - No restrictions    Complete by:  As directed      Diet Carb Modified    Complete by:  As directed      Discharge instructions    Complete by:  As directed   Patient has been instructed to take medications as prescribed; and report adverse effects to outpatient provider.  Follow up with primary doctor for any medical issues and If symptoms recur report to nearest emergency or crisis hot line.            Medication List    STOP taking these medications        amoxicillin-clavulanate 875-125 MG tablet  Commonly known as:  AUGMENTIN     butalbital-acetaminophen-caffeine 50-325-40 MG tablet  Commonly known as:  ESGIC     carisoprodol 350 MG tablet  Commonly known as:  SOMA     famotidine 20 MG tablet  Commonly known as:  PEPCID     HYDROcodone-acetaminophen 5-325 MG tablet  Commonly known as:  NORCO/VICODIN     ibuprofen 200 MG tablet  Commonly known as:  ADVIL,MOTRIN     ibuprofen 800 MG tablet  Commonly known as:  ADVIL,MOTRIN     meloxicam 15 MG tablet  Commonly known as:  MOBIC     predniSONE 10 MG tablet  Commonly known as:  STERAPRED UNI-PAK     traMADol 50 MG tablet  Commonly known as:  ULTRAM      TAKE these medications      Indication   FLUoxetine 10 MG capsule  Commonly known as:  PROZAC  Take 1 capsule (10 mg total) by mouth daily.   Indication:  Excessive Use of Alcohol, Depression     naproxen 500 MG tablet  Commonly known as:  NAPROSYN  Take 1 tablet (500 mg total) by mouth 2 (two) times daily with a meal.      nicotine 21 mg/24hr patch  Commonly known as:  NICODERM CQ  Place 1 patch (21 mg total) onto the  skin daily.      pantoprazole 20 MG tablet  Commonly known as:  PROTONIX  Take 1 tablet (20 mg total) by mouth daily.   Indication:  Gastroesophageal Reflux Disease       Follow-up Information    [facility] On 02/26/2015.  Why:  Arrive between 9am-12pm on this date for assessment (medication management/Counseling)   Contact information:   [address] [place], [place] [address] Phone: [phone] Fax: [phone]      Follow-up recommendations:  Activity:  as tolerated Diet:  carbohydrates modification  Comments:  Take all of you medications as prescribed by your mental healthcare provider.  Report any adverse effects and reactions from your medications to your outpatient provider promptly. Do not engage in alcohol and or illegal drug use while on prescription medicines. In the event of worsening symptoms call the crisis hotline, 911, and or go to the nearest emergency department for appropriate evaluation and treatment of symptoms. Follow-up with your primary care provider for your medical issues, concerns and or health care needs.   Keep all scheduled appointments.  If you are unable to keep an appointment call to reschedule.  Let the nurse know if you will need medications before next scheduled appointment.  Total Discharge Time: 30 minutes   Signed: Oneta Rackanika N Lewis FNP- BC 02/24/2015, 9:18 AM  I have examined the patient and agree with the discharge plan and findings. I have also done suicide assessment on this patient.

## 2015-02-24 NOTE — [facility] Suicide Risk Assessment (Signed)
[facility]   Demographic Factors:  Male  Total Time spent with patient: 30 minutes  Musculoskeletal: Strength & Muscle Tone: within normal limits Gait & Station: normal Patient leans: N/A  Psychiatric Specialty Exam: Physical Exam  Constitutional: He appears well-developed.  Skin: He is not diaphoretic.    Review of Systems  Constitutional: Negative.   Psychiatric/Behavioral: Negative for suicidal ideas. The patient is nervous/anxious.     Blood pressure 115/90, pulse 80, temperature 97.8 F (36.6 C), temperature source Oral, resp. rate 16, height 5\' 11"  (1.803 m), weight 63.957 kg (141 lb).Body mass index is 19.67 kg/(m^2).  General Appearance: Casual  Eye Contact::  Fair  Speech:  Slow409  Volume:  Normal  Mood:  euthymic  Affect:  Congruent  Thought Process:  Coherent  Orientation:  Full (Time, Place, and Person)  Thought Content:  Negative  Suicidal Thoughts:  No  Homicidal Thoughts:  No  Memory:  Immediate;   Fair Recent;   Fair  Judgement:  Fair  Insight:  Fair  Psychomotor Activity:  Normal  Concentration:  Fair  Recall:  F[employer]ervFair  Fund of Knowledge:Fair  Language: Fair  Akathisia:  Negative  Handed:  Right  AIMS (if indicated):     Assets:  Desire for Improvement Social Support  Sleep:  Number of Hours: 6.5  Cognition: WNL  ADL's:  Intact   Have you used any form of tobacco in the last 30 days? (Cigarettes, Smokeless Tobacco, Cigars, and/or Pipes): Yes  Has this patient used any form of tobacco in the last 30 days? (Cigarettes, Smokeless Tobacco, Cigars, and/or Pipes) Yes, A prescription for an FDA-approved tobacco cessation medication was offered at discharge and the patient refused  Mental Status Per Nursing Assessment::   On Admission:  Self-harm thoughts  Current Mental Status by Physician: see above  Loss Factors: Financial problems/change in socioeconomic status  Historical Factors: Impulsivity  Risk Reduction  Factors:   Positive therapeutic relationship and Positive coping skills or problem solving skills  Continued Clinical Symptoms:  Dysthymia Alcohol/Substance Abuse/Dependencies More than one psychiatric diagnosis  Cognitive Features That Contribute To Risk:  Closed-mindedness    Suicide Risk:  Minimal: No identifiable suicidal ideation.  Patients presenting with no risk factors but with morbid ruminations; may be classified as minimal risk based on the severity of the depressive symptoms  Principal Problem: Substance induced mood disorder [facility](HCC) Discharge Diagnoses:  Patient Active Problem List   Diagnosis Date Noted  . Substance induced mood disorder (HCC) [F19.94]   . Alcohol use disorder, severe, dependence (HCC) [F10.20] 02/21/2015  . 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 Right knee pain [M25.561] 06/27/2013  . Smoking [Z72.0] 06/27/2013  . Chronic low back pain [M54.5, G89.29] 03/16/2013    Follow-up Information    [facility] On 02/26/2015.   Why:  Arrive between 9am-12pm on this date for assessment (medication management/Counseling)   Contact information:   [address] [place], [place] [address] Phone: [phone] Fax: [phone]      Plan Of Care/Follow-up recommendations:  Activity:  as tolerated Diet:   regular  Is patient on multiple antipsychotic therapies at discharge:  No   Has Patient had three or more failed trials of antipsychotic monotherapy by history:  No  Recommended Plan for Multiple Antipsychotic Therapies: NA    Teonna Coonan 02/24/2015, 10:13 AM

## 2015-02-24 NOTE — Progress Notes (Signed)
[facility] D/C Note:Pt denies si/hi at this time. States that he will comply with outpt services and take his meds as prescribed. D/C to home with parents providing transportation.

## 2015-02-24 NOTE — [facility] Group Notes (Signed)
[facility] Group Notes:  (Clinical Social Work)  02/24/2015  10:00-11:00AM  Summary of Progress/Problems:   The main focus of today's process group was to   1)  discuss the importance of adding supports  2)  define health supports versus unhealthy supports  3)  plan how to handle current unhealthy supports  4)  Identify healthy supports to add.  The patient expressed full understanding of the concepts, and while he was initially resistant, he eventually acknowledged understanding the difference between healthy and unhealthy supports.  He was discharged in the middle of group, said a friendly and encouraging goodbye to everyone.  Type of Therapy:  Process Group with Motivational Interviewing  Participation Level:  Active  Participation Quality:  Attentive  Affect:  Appropriate  Cognitive:  Appropriate  Insight:  Engaged  Engagement in Therapy:  Engaged  Modes of Intervention:   Education, Support and Processing, Activity  Ambrose MantleMareida Grossman-Orr, LCSW 02/24/2015

## 2015-03-24 ENCOUNTER — Emergency Department (HOSPITAL_COMMUNITY): Payer: Self-pay

## 2015-03-24 ENCOUNTER — Emergency Department (HOSPITAL_COMMUNITY)
Admission: EM | Admit: 2015-03-24 | Discharge: 2015-03-24 | Disposition: A | Discharge status: Home / Self Care | Payer: Self-pay | Attending: Emergency Medicine | Admitting: Emergency Medicine

## 2015-03-24 ENCOUNTER — Encounter (HOSPITAL_COMMUNITY): Payer: Self-pay | Admitting: Emergency Medicine

## 2015-03-24 DIAGNOSIS — Y998 Other external cause status: Secondary | ICD-10-CM | POA: Insufficient documentation

## 2015-03-24 DIAGNOSIS — Z8739 Personal history of other diseases of the musculoskeletal system and connective tissue: Secondary | ICD-10-CM | POA: Insufficient documentation

## 2015-03-24 DIAGNOSIS — Y9389 Activity, other specified: Secondary | ICD-10-CM | POA: Insufficient documentation

## 2015-03-24 DIAGNOSIS — Z79899 Other long term (current) drug therapy: Secondary | ICD-10-CM | POA: Insufficient documentation

## 2015-03-24 DIAGNOSIS — T07XXXA Unspecified multiple injuries, initial encounter: Secondary | ICD-10-CM

## 2015-03-24 DIAGNOSIS — Y9289 Other specified places as the place of occurrence of the external cause: Secondary | ICD-10-CM | POA: Insufficient documentation

## 2015-03-24 DIAGNOSIS — F17210 Nicotine dependence, cigarettes, uncomplicated: Secondary | ICD-10-CM | POA: Insufficient documentation

## 2015-03-24 DIAGNOSIS — S0081XA Abrasion of other part of head, initial encounter: Secondary | ICD-10-CM | POA: Insufficient documentation

## 2015-03-24 DIAGNOSIS — Z23 Encounter for immunization: Secondary | ICD-10-CM | POA: Insufficient documentation

## 2015-03-24 DIAGNOSIS — S39012A Strain of muscle, fascia and tendon of lower back, initial encounter: Secondary | ICD-10-CM | POA: Insufficient documentation

## 2015-03-24 MED ORDER — TETANUS-DIPHTH-ACELL PERTUSSIS 5-2.5-18.5 LF-MCG/0.5 IM SUSP
0.5000 mL | Freq: Once | INTRAMUSCULAR | Status: AC
  Administered 2015-03-24: 0.5 mL via INTRAMUSCULAR
  Filled 2015-03-24: qty 0.5

## 2015-03-24 MED ORDER — HYDROCODONE-ACETAMINOPHEN 5-325 MG PO TABS
1.0000 | ORAL_TABLET | Freq: Once | ORAL | Status: AC
Start: 2015-03-24 — End: 2015-03-24
  Administered 2015-03-24: 1 via ORAL
  Filled 2015-03-24: qty 1
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
## 2016-09-17 ENCOUNTER — Encounter (HOSPITAL_COMMUNITY): Payer: Self-pay

## 2016-09-17 ENCOUNTER — Emergency Department (HOSPITAL_COMMUNITY)
Admission: EM | Admit: 2016-09-17 | Discharge: 2016-09-17 | Disposition: A | Discharge status: Home / Self Care | Payer: Self-pay | Attending: Emergency Medicine | Admitting: Emergency Medicine

## 2016-09-17 ENCOUNTER — Emergency Department (HOSPITAL_COMMUNITY): Payer: Self-pay

## 2016-09-17 DIAGNOSIS — R802 Orthostatic proteinuria, unspecified: Secondary | ICD-10-CM | POA: Insufficient documentation

## 2016-09-17 DIAGNOSIS — I951 Orthostatic hypotension: Secondary | ICD-10-CM

## 2016-09-17 DIAGNOSIS — Z79899 Other long term (current) drug therapy: Secondary | ICD-10-CM | POA: Insufficient documentation

## 2016-09-17 DIAGNOSIS — F17210 Nicotine dependence, cigarettes, uncomplicated: Secondary | ICD-10-CM | POA: Insufficient documentation

## 2016-09-17 DIAGNOSIS — F1024 Alcohol dependence with alcohol-induced mood disorder: Secondary | ICD-10-CM | POA: Insufficient documentation

## 2016-09-17 DIAGNOSIS — R55 Syncope and collapse: Secondary | ICD-10-CM | POA: Insufficient documentation

## 2016-09-17 LAB — CBC WITH DIFFERENTIAL/PLATELET
Basophils Absolute: 0 10*3/uL (ref 0.0–0.1)
Basophils Relative: 1 %
EOS PCT: 1 %
Eosinophils Absolute: 0.1 10*3/uL (ref 0.0–0.7)
HCT: 46.1 % (ref 39.0–52.0)
Hemoglobin: 15.8 g/dL (ref 13.0–17.0)
LYMPHS PCT: 17 %
Lymphs Abs: 1 10*3/uL (ref 0.7–4.0)
MCH: 32 pg (ref 26.0–34.0)
MCHC: 34.3 g/dL (ref 30.0–36.0)
MCV: 93.3 fL (ref 78.0–100.0)
MONO ABS: 0.3 10*3/uL (ref 0.1–1.0)
Monocytes Relative: 5 %
Neutro Abs: 4.5 10*3/uL (ref 1.7–7.7)
Neutrophils Relative %: 76 %
Platelets: 187 10*3/uL (ref 150–400)
RBC: 4.94 MIL/uL (ref 4.22–5.81)
RDW: 14.6 % (ref 11.5–15.5)
WBC: 5.9 10*3/uL (ref 4.0–10.5)

## 2016-09-17 LAB — RAPID URINE DRUG SCREEN, HOSP PERFORMED
Amphetamines: NOT DETECTED
BARBITURATES: NOT DETECTED
Benzodiazepines: NOT DETECTED
Cocaine: NOT DETECTED
Opiates: NOT DETECTED
TETRAHYDROCANNABINOL: NOT DETECTED

## 2016-09-17 LAB — URINALYSIS, ROUTINE W REFLEX MICROSCOPIC
Bacteria, UA: NONE SEEN
Bilirubin Urine: NEGATIVE
GLUCOSE, UA: NEGATIVE mg/dL
HGB URINE DIPSTICK: NEGATIVE
Ketones, ur: 20 mg/dL — AB
LEUKOCYTES UA: NEGATIVE
NITRITE: NEGATIVE
PROTEIN: 30 mg/dL — AB
SQUAMOUS EPITHELIAL / LPF: NONE SEEN
Specific Gravity, Urine: 1.023 (ref 1.005–1.030)
pH: 6 (ref 5.0–8.0)

## 2016-09-17 LAB — COMPREHENSIVE METABOLIC PANEL WITH GFR
ALT: 55 U/L (ref 17–63)
AST: 36 U/L (ref 15–41)
Albumin: 4.4 g/dL (ref 3.5–5.0)
Alkaline Phosphatase: 80 U/L (ref 38–126)
Anion gap: 13 (ref 5–15)
BUN: 12 mg/dL (ref 6–20)
CO2: 25 mmol/L (ref 22–32)
Calcium: 8.8 mg/dL — ABNORMAL LOW (ref 8.9–10.3)
Chloride: 103 mmol/L (ref 101–111)
Creatinine, Ser: 1.15 mg/dL (ref 0.61–1.24)
GFR calc Af Amer: >60 mL/min
GFR calc non Af Amer: >60 mL/min
Glucose, Bld: 81 mg/dL (ref 65–99)
Potassium: 4.1 mmol/L (ref 3.5–5.1)
Sodium: 141 mmol/L (ref 135–145)
Total Bilirubin: 0.7 mg/dL (ref 0.3–1.2)
Total Protein: 8.1 g/dL (ref 6.5–8.1)

## 2016-09-17 LAB — CBG MONITORING, ED: GLUCOSE-CAPILLARY: 71 mg/dL (ref 65–99)

## 2016-09-17 LAB — ACETAMINOPHEN LEVEL

## 2016-09-17 LAB — SALICYLATE LEVEL: Salicylate Lvl: <7 mg/dL (ref 2.8–30.0)

## 2016-09-17 LAB — ETHANOL: Alcohol, Ethyl (B): 22 mg/dL — ABNORMAL HIGH (ref <5)

## 2016-09-17 MED ORDER — SODIUM CHLORIDE 0.9 % IV BOLUS (SEPSIS)
1000.0000 mL | Freq: Once | INTRAVENOUS | Status: AC
  Administered 2016-09-17: 1000 mL via INTRAVENOUS
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
## 2016-09-17 NOTE — ED Notes (Signed)
RN at bedside collecting labs 
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
## 2016-09-17 NOTE — ED Notes (Signed)
Pt ambulatory and independent at discharge.  Verbalized understanding of discharge instructions 
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
## 2016-09-17 NOTE — ED Notes (Signed)
Bed: WA01 Expected date:  Expected time:  Means of arrival:  Comments: EMS dizziness

## 2016-11-02 ENCOUNTER — Ambulatory Visit: Payer: Self-pay | Admitting: Family Medicine

## 2017-06-30 ENCOUNTER — Emergency Department (HOSPITAL_COMMUNITY): Payer: Self-pay

## 2017-06-30 ENCOUNTER — Encounter (HOSPITAL_COMMUNITY): Payer: Self-pay

## 2017-06-30 ENCOUNTER — Emergency Department (HOSPITAL_COMMUNITY)
Admission: EM | Admit: 2017-06-30 | Discharge: 2017-06-30 | Disposition: A | Discharge status: Home / Self Care | Payer: Self-pay | Attending: Emergency Medicine | Admitting: Emergency Medicine

## 2017-06-30 DIAGNOSIS — X030XXA Exposure to flames in controlled fire, not in building or structure, initial encounter: Secondary | ICD-10-CM | POA: Insufficient documentation

## 2017-06-30 DIAGNOSIS — Y999 Unspecified external cause status: Secondary | ICD-10-CM | POA: Insufficient documentation

## 2017-06-30 DIAGNOSIS — Y929 Unspecified place or not applicable: Secondary | ICD-10-CM | POA: Insufficient documentation

## 2017-06-30 DIAGNOSIS — Y9341 Activity, dancing: Secondary | ICD-10-CM | POA: Insufficient documentation

## 2017-06-30 DIAGNOSIS — T2010XA Burn of first degree of head, face, and neck, unspecified site, initial encounter: Secondary | ICD-10-CM | POA: Insufficient documentation

## 2017-06-30 DIAGNOSIS — F17210 Nicotine dependence, cigarettes, uncomplicated: Secondary | ICD-10-CM | POA: Insufficient documentation

## 2017-06-30 DIAGNOSIS — S39012A Strain of muscle, fascia and tendon of lower back, initial encounter: Secondary | ICD-10-CM | POA: Insufficient documentation

## 2017-06-30 MED ORDER — METHOCARBAMOL 500 MG PO TABS: 500.0000 mg | ORAL_TABLET | Freq: Two times a day (BID) | ORAL | 0 refills | Status: DC

## 2017-06-30 MED ORDER — LIDOCAINE 5 % EX PTCH: 1.0000 | MEDICATED_PATCH | CUTANEOUS | 0 refills | Status: DC

## 2017-06-30 MED ORDER — SILVER SULFADIAZINE 1 % EX CREA
TOPICAL_CREAM | Freq: Once | CUTANEOUS | Status: AC
  Administered 2017-06-30: 18:00:00 via TOPICAL
  Filled 2017-06-30: qty 50

## 2017-06-30 MED ORDER — SILVER SULFADIAZINE 1 % EX CREA: 1.0000 "application " | TOPICAL_CREAM | Freq: Every day | CUTANEOUS | 0 refills | Status: DC
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
## 2017-06-30 NOTE — ED Notes (Signed)
Patient transported to X-ray 
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
## 2017-06-30 NOTE — Discharge Instructions (Signed)
Your x-ray today was negative for acute injuries.  It did show some worsening of your degenerative disc disease/arthritis in the lumbar spine. Follow instructions regarding back injury prevention and burn care. Apply Silvadene cream to affected area. Take Robaxin as needed for back pain.  He can take this with Tylenol and ibuprofen. Apply lidocaine patches to help with back pain as well. Return to ED for worsening symptoms, additional injuries or falls, trouble breathing, productive cough, numbness in legs.

## 2018-10-19 ENCOUNTER — Ambulatory Visit (HOSPITAL_COMMUNITY)
Admission: AD | Admit: 2018-10-19 | Discharge: 2018-10-19 | Disposition: A | Discharge status: Home / Self Care | Payer: Federal, State, Local not specified - Other | Attending: Psychiatry | Admitting: Psychiatry

## 2018-10-19 DIAGNOSIS — Z833 Family history of diabetes mellitus: Secondary | ICD-10-CM | POA: Insufficient documentation

## 2018-10-19 DIAGNOSIS — Z809 Family history of malignant neoplasm, unspecified: Secondary | ICD-10-CM | POA: Diagnosis not present

## 2018-10-19 DIAGNOSIS — F17210 Nicotine dependence, cigarettes, uncomplicated: Secondary | ICD-10-CM | POA: Diagnosis not present

## 2018-10-19 DIAGNOSIS — Z7289 Other problems related to lifestyle: Secondary | ICD-10-CM | POA: Diagnosis present

## 2018-10-20 ENCOUNTER — Other Ambulatory Visit: Payer: Self-pay

## 2018-10-20 ENCOUNTER — Encounter (HOSPITAL_COMMUNITY): Payer: Self-pay

## 2018-10-20 ENCOUNTER — Observation Stay (HOSPITAL_COMMUNITY)
Admission: AD | Admit: 2018-10-20 | Discharge: 2018-10-20 | Disposition: A | Discharge status: Home / Self Care | Payer: Federal, State, Local not specified - Other | Attending: Psychiatry | Admitting: Psychiatry

## 2018-10-20 DIAGNOSIS — F1414 Cocaine abuse with cocaine-induced mood disorder: Principal | ICD-10-CM | POA: Insufficient documentation

## 2018-10-20 DIAGNOSIS — F329 Major depressive disorder, single episode, unspecified: Secondary | ICD-10-CM | POA: Diagnosis not present

## 2018-10-20 DIAGNOSIS — Z79899 Other long term (current) drug therapy: Secondary | ICD-10-CM | POA: Diagnosis not present

## 2018-10-20 DIAGNOSIS — F17210 Nicotine dependence, cigarettes, uncomplicated: Secondary | ICD-10-CM | POA: Insufficient documentation

## 2018-10-20 DIAGNOSIS — F1020 Alcohol dependence, uncomplicated: Secondary | ICD-10-CM | POA: Insufficient documentation

## 2018-10-20 DIAGNOSIS — F1994 Other psychoactive substance use, unspecified with psychoactive substance-induced mood disorder: Secondary | ICD-10-CM

## 2018-10-20 LAB — COMPREHENSIVE METABOLIC PANEL
ALT: 36 U/L (ref 0–44)
AST: 24 U/L (ref 15–41)
Albumin: 4.4 g/dL (ref 3.5–5.0)
Alkaline Phosphatase: 89 U/L (ref 38–126)
Anion gap: 12 (ref 5–15)
BUN: 15 mg/dL (ref 6–20)
CO2: 23 mmol/L (ref 22–32)
Calcium: 9.1 mg/dL (ref 8.9–10.3)
Chloride: 103 mmol/L (ref 98–111)
Creatinine, Ser: 1.04 mg/dL (ref 0.61–1.24)
GFR calc Af Amer: >60 mL/min (ref >60)
GFR calc non Af Amer: >60 mL/min (ref >60)
Glucose, Bld: 88 mg/dL (ref 70–99)
Potassium: 4 mmol/L (ref 3.5–5.1)
Sodium: 138 mmol/L (ref 135–145)
Total Bilirubin: 0.1 mg/dL — ABNORMAL LOW (ref 0.3–1.2)
Total Protein: 7.6 g/dL (ref 6.5–8.1)

## 2018-10-20 LAB — RAPID URINE DRUG SCREEN, HOSP PERFORMED
Amphetamines: NOT DETECTED
Barbiturates: NOT DETECTED
Benzodiazepines: NOT DETECTED
Cocaine: NOT DETECTED
Opiates: NOT DETECTED
Tetrahydrocannabinol: NOT DETECTED

## 2018-10-20 LAB — CBC
HCT: 44 % (ref 39.0–52.0)
Hemoglobin: 14.7 g/dL (ref 13.0–17.0)
MCH: 32 pg (ref 26.0–34.0)
MCHC: 33.4 g/dL (ref 30.0–36.0)
MCV: 95.7 fL (ref 80.0–100.0)
Platelets: 250 10*3/uL (ref 150–400)
RBC: 4.6 MIL/uL (ref 4.22–5.81)
RDW: 14.5 % (ref 11.5–15.5)
WBC: 6.8 10*3/uL (ref 4.0–10.5)
nRBC: 0 % (ref 0.0–0.2)

## 2018-10-20 LAB — ETHANOL: Alcohol, Ethyl (B): 95 mg/dL — ABNORMAL HIGH (ref <10)

## 2018-10-20 MED ORDER — NICOTINE 21 MG/24HR TD PT24
21.0000 mg | MEDICATED_PATCH | Freq: Every day | TRANSDERMAL | Status: DC
  Administered 2018-10-20: 21 mg via TRANSDERMAL
  Filled 2018-10-20: qty 1

## 2018-10-20 MED ORDER — LORAZEPAM 1 MG PO TABS
1.0000 mg | ORAL_TABLET | Freq: Four times a day (QID) | ORAL | Status: DC
  Administered 2018-10-20 (×2): 1 mg via ORAL
  Filled 2018-10-20: qty 1

## 2018-10-20 MED ORDER — LOPERAMIDE HCL 2 MG PO CAPS: 2.0000 mg | ORAL_CAPSULE | ORAL | Status: DC | PRN

## 2018-10-20 MED ORDER — LORAZEPAM 1 MG PO TABS: 1.0000 mg | ORAL_TABLET | Freq: Two times a day (BID) | ORAL | Status: DC

## 2018-10-20 MED ORDER — HYDROXYZINE HCL 25 MG PO TABS: 25.0000 mg | ORAL_TABLET | Freq: Four times a day (QID) | ORAL | Status: DC | PRN

## 2018-10-20 MED ORDER — IBUPROFEN 400 MG PO TABS
400.0000 mg | ORAL_TABLET | Freq: Four times a day (QID) | ORAL | Status: DC | PRN
  Administered 2018-10-20: 400 mg via ORAL
  Filled 2018-10-20: qty 1

## 2018-10-20 MED ORDER — ONDANSETRON 4 MG PO TBDP: 4.0000 mg | ORAL_TABLET | Freq: Four times a day (QID) | ORAL | Status: DC | PRN

## 2018-10-20 MED ORDER — LORAZEPAM 1 MG PO TABS: 1.0000 mg | ORAL_TABLET | Freq: Three times a day (TID) | ORAL | Status: DC

## 2018-10-20 MED ORDER — LORAZEPAM 1 MG PO TABS: 1.0000 mg | ORAL_TABLET | Freq: Every day | ORAL | Status: DC

## 2018-10-20 MED ORDER — ALUM & MAG HYDROXIDE-SIMETH 200-200-20 MG/5ML PO SUSP: 30.0000 mL | ORAL | Status: DC | PRN

## 2018-10-20 MED ORDER — VITAMIN B-1 100 MG PO TABS: 100.0000 mg | ORAL_TABLET | Freq: Every day | ORAL | Status: DC

## 2018-10-20 MED ORDER — ACETAMINOPHEN 325 MG PO TABS: 650.0000 mg | ORAL_TABLET | Freq: Four times a day (QID) | ORAL | Status: DC | PRN

## 2018-10-20 MED ORDER — ADULT MULTIVITAMIN W/MINERALS CH
1.0000 | ORAL_TABLET | Freq: Every day | ORAL | Status: DC
  Administered 2018-10-20: 1 via ORAL
  Filled 2018-10-20: qty 1

## 2018-10-20 MED ORDER — LORAZEPAM 1 MG PO TABS
1.0000 mg | ORAL_TABLET | Freq: Four times a day (QID) | ORAL | Status: DC | PRN
  Filled 2018-10-20: qty 1

## 2018-10-20 MED ORDER — MAGNESIUM HYDROXIDE 400 MG/5ML PO SUSP: 30.0000 mL | Freq: Every day | ORAL | Status: DC | PRN

## 2018-10-20 NOTE — Progress Notes (Signed)
UA cup given. Pt made aware. Pending sample.  

## 2018-10-20 NOTE — BH Assessment (Signed)
[facility] Assessment Progress Note  Per Myles Lipps, MD, this pt does not require psychiatric hospitalization at this time.  Pt presents under IVC initiated by pt's mother, which Dr Mallie Darting has rescinded.  Pt is to be discharged from the [facility] Observ[facility] with referral information for [facility] [place].  This has been included in pt's discharge instructions.  Pt's nurse, Millard, has been notified.  Jalene Mullet, [facility] Triage Specialist [phone]

## 2018-10-20 NOTE — H&P (Signed)
Behavioral Health Medical Screening Exam  Brady Kim is an 53 y.o. male.  Psychiatric Specialty Exam: Physical Exam  Constitutional: He is oriented to person, place, and time. He appears well-developed and well-nourished. No distress.  HENT:  Head: Normocephalic and atraumatic.  Right Ear: External ear normal.  Left Ear: External ear normal.  Eyes: Pupils are equal, round, and reactive to light. Right eye exhibits no discharge. Left eye exhibits no discharge.  Respiratory: Effort normal. No respiratory distress.  Musculoskeletal: Normal range of motion.  Neurological: He is alert and oriented to person, place, and time.  Skin: He is not diaphoretic.  Psychiatric: He exhibits a depressed mood.    Review of Systems  Constitutional: Negative for chills, diaphoresis, fever, malaise/fatigue and weight loss.  Respiratory: Negative for cough and shortness of breath.   Cardiovascular: Negative for chest pain.  Gastrointestinal: Negative for diarrhea, nausea and vomiting.  Neurological: Negative for dizziness.  Psychiatric/Behavioral: Positive for depression and substance abuse. Negative for hallucinations, memory loss and suicidal ideas. The patient is not nervous/anxious and does not have insomnia.     There were no vitals taken for this visit.There is no height or weight on file to calculate BMI.  General Appearance: Casual and Disheveled  Eye Contact:  Fair  Speech:  Garbled and Normal Rate  Volume:  Normal  Mood:  Depressed  Affect:  Congruent and Depressed  Thought Process:  Coherent  Orientation:  Full (Time, Place, and Person)  Thought Content:  Logical and Hallucinations: None  Suicidal Thoughts:  No  Homicidal Thoughts:  No  Memory:  Immediate;   Fair Recent;   Fair  Judgement:  Impaired  Insight:  Lacking  Psychomotor Activity:  Normal  Concentration:  Concentration: Fair and Attention Span: Fair  Recall:  [employer] of Knowledge:  Fair  Language:  Fair   Akathisia:  Negative  Handed:  Right  AIMS (if indicated):     Assets:  Housing Intimacy Leisure Time Physical Health  ADL's:  Intact  Cognition:  WNL  Sleep:       Recommendations:  Based on my evaluation the patient does not appear to have an emergency medical condition.  Rozetta Nunnery, NP 10/20/2018, 3:01 AM

## 2018-10-20 NOTE — Consult Note (Signed)
[facility]  10/20/2018 9:31 AM Brady SledgeRonnie Kim  MRN:  [id number] Principal Problem: <principal problem not specified> Discharge Diagnoses: Active Problems:   Substance induced mood disorder (HCC)   Subjective: Patient is seen and examined. Patient is a 53 YO male with a long standing history of alcohol dependence and previous cocaine use disorder who [facility] mother placed him under IVC because of drug and alcohol usage. Patient admits to daily drinking. He stated he used cocaine "sometimes". [facility] secondary to his alcohol and drug usage. He admitted to intoxication last pm. Blood alcohol in ED was 95. The rest of his labs WNL. He denied any previous seizures or complicated alcohol withdrawal syndromes. His blood pressure was elevated but slept well. No tremulousness.Previously at our institution in 2016 he had been prescribed fluoxetine. He denied A/V hallucinations, homicidal or suicidal ideation. The patient did not fulffill criteria for involuntary commitment, and was not interested in substance abuse treatment. It was decided [facility] be [facility]  Total Time spent with patient: 20 minutes  Past Psychiatric History: at least 2 previous psych admissions for substance and alcohol related issues  Past Medical History:  Past Medical History:  Diagnosis Date  . Back pain   . Degenerative disc disease     Past Surgical History:  Procedure Laterality Date  . ANKLE FUSION    . KNEE ARTHROSCOPY     Family History:  Family History  Problem Relation Age of Onset  . Hypertension Mother   . Diabetes Maternal Aunt   . Cancer Maternal Aunt   . Cancer Maternal Grandmother    Family Psychiatric  History: non- contributory Social History:  Social History   Substance and Sexual Activity  Alcohol Use Yes   Comment: Occasional     Social History   Substance and Sexual  Activity  Drug Use No    Social History   Socioeconomic History  . Marital status: Married    Spouse name: Not on file  . Number of children: Not on file  . Years of education: Not on file  . Highest education level: Not on file  Occupational History  . Not on file  Social Needs  . Financial resource strain: Not on file  . Food insecurity    Worry: Not on file    Inability: Not on file  . Transportation needs    Medical: Not on file    Non-medical: Not on file  Tobacco Use  . Smoking status: Current Every Day Smoker    Packs/day: 0.50    Types: Cigarettes  . Smokeless tobacco: Never Used  Substance and Sexual Activity  . Alcohol use: Yes    Comment: Occasional  . Drug use: No  . Sexual activity: Not on file  Lifestyle  . Physical activity    Days per week: Not on file    Minutes per session: Not on file  . Stress: Not on file  Relationships  . Social Musicianconnections    Talks on phone: Not on file    Gets together: Not on file    Attends religious service: Not on file    Active member of club or organization: Not on file    Attends meetings of clubs or organizations: Not on file    Relationship status: Not on file  Other Topics Concern  . Not on file  Social History Narrative  . Not on file    Has this patient used any form of tobacco in the last 30 days? (Cigarettes, Smokeless Tobacco, Cigars, and/or Pipes) Prescription not provided because: does not want to quit smoking  Current Medications: Current Facility-Administered Medications  Medication Dose Route Frequency Provider Last Rate Last Dose  . alum & mag hydroxide-simeth (MAALOX/MYLANTA) 200-200-20 MG/5ML suspension 30 mL  30 mL Oral Q4H PRN Lindon Romp A, NP      . hydrOXYzine (ATARAX/VISTARIL) tablet 25 mg  25 mg Oral Q6H PRN Lindon Romp A, NP      . ibuprofen (ADVIL) tablet 400 mg  400 mg Oral Q6H PRN Lindon Romp A, NP   400 mg at 10/20/18 0135  . loperamide (IMODIUM) capsule 2-4 mg  2-4 mg Oral PRN  Lindon Romp A, NP      . LORazepam (ATIVAN) tablet 1 mg  1 mg Oral Q6H PRN Lindon Romp A, NP      . magnesium hydroxide (MILK OF MAGNESIA) suspension 30 mL  30 mL Oral Daily PRN Lindon Romp A, NP      . multivitamin with minerals tablet 1 tablet  1 tablet Oral Daily Lindon Romp A, NP      . nicotine (NICODERM CQ - dosed in mg/24 hours) patch 21 mg  21 mg Transdermal Daily Lindon Romp A, NP      . ondansetron (ZOFRAN-ODT) disintegrating tablet 4 mg  4 mg Oral Q6H PRN Rozetta Nunnery, NP      . Derrill Memo ON 10/21/2018] thiamine (VITAMIN B-1) tablet 100 mg  100 mg Oral Daily Lindon Romp A, NP       PTA Medications: Medications Prior to Admission  Medication Sig Dispense Refill Last Dose  . acetaminophen (TYLENOL) 325 MG tablet Take 650 mg by mouth every 6 (six) hours as needed for moderate pain.   Not Taking at Unknown time  . FLUoxetine (PROZAC) 10 MG capsule Take 1 capsule (10 mg total) by mouth daily. (Patient not taking: Reported on 09/17/2016) 30 capsule 0 Not Taking at Unknown time  . lidocaine (LIDODERM) 5 % Place 1 patch onto the skin daily. Remove & Discard patch within 12 hours or as directed by MD (Patient not taking: Reported on 10/20/2018) 30 patch 0 Not Taking at Unknown time  . methocarbamol (ROBAXIN) 500 MG tablet Take 1 tablet (500 mg total) by mouth 2 (two) times daily. (Patient not taking: Reported on 10/20/2018) 20 tablet 0 Not Taking at Unknown time  . pantoprazole (PROTONIX) 20 MG tablet Take 1 tablet (20 mg total) by mouth daily. (Patient not taking: Reported on 09/17/2016) 30 tablet 0 Not Taking at Unknown time  . silver sulfADIAZINE (SILVADENE) 1 % cream Apply 1 application topically daily. (Patient not taking: Reported on 10/20/2018) 50 g 0 Not Taking at Unknown time    Musculoskeletal: Strength & Muscle Tone: within normal limits Gait & Station: normal Patient leans: N/A  Psychiatric Specialty Exam: Physical Exam  Nursing note and vitals reviewed. Constitutional: He is  oriented to person, place, and time. He appears well-developed and well-nourished.  HENT:  Head: Normocephalic and atraumatic.  Respiratory: Effort normal.  Neurological: He is alert and oriented to person, place, and time.    ROS  There were no vitals taken for this visit.There is no height or weight on file to calculate BMI.  General Appearance: Casual  Eye Contact:  Fair  Speech:  Normal Rate  Volume:  Normal  Mood:  Euthymic  Affect:  Congruent  Thought Process:  Coherent and Descriptions of Associations: Circumstantial  Orientation:  Full (Time, Place, and Person)  Thought Content:  Logical  Suicidal Thoughts:  No  Homicidal Thoughts:  No  Memory:  Immediate;   Fair Recent;   Fair Remote;   Fair  Judgement:  Intact  Insight:  Lacking  Psychomotor Activity:  Normal  Concentration:  Concentration: Fair and Attention Span: Fair  Recall:  [employer]  Fund of Knowledge:  Fair  Language:  Fair  Akathisia:  Negative  Handed:  Right  AIMS (if indicated):     Assets:  Desire for Improvement Resilience  ADL's:  Intact  Cognition:  WNL  Sleep:        Demographic Factors:  Male, Divorced or widowed, Low socioeconomic status and Unemployed  Loss Factors: NA  Historical Factors: Impulsivity  Risk Reduction Factors:   Living with another person, especially a relative  Continued Clinical Symptoms:  Depression:   Comorbid alcohol abuse/dependence Impulsivity Alcohol/Substance Abuse/Dependencies  Cognitive Features That Contribute To Risk:  None    Suicide Risk:  Minimal: No identifiable suicidal ideation.  Patients presenting with no risk factors but with morbid ruminations; may be classified as minimal risk based on the severity of the depressive symptoms    Plan Of Care/Follow-up recommendations:  Activity:  ad lib  Disposition: referral to outpatient substance abuse treatment Antonieta PertGreg Lawson Areal Cochrane, MD 10/20/2018, 9:31 AM

## 2018-10-20 NOTE — Plan of Care (Signed)
[facility] Observation Crisis Plan  Reason for Crisis Plan:  Substance Abuse   Plan of Care:  Referral for Substance Abuse and Referral for Telepsychiatry/Psychiatric Consult  Family Support:   Parent and significant other   Current Living Environment:  Living Arrangements: Parent  Insurance:   Hospital Account    Name Acct ID Class Status Primary Coverage   Brady Kim, Brady Kim [id number] [place] [facility]        Guarantor Account (for Hospital Account [id number])    Name Relation to Pt Service Area Active? Acct Type   Brady Kim [facility] Yes Behavioral Health   Address Phone       [address] [place], [place] [address] [phone])          Coverage Information (for Hospital Account [id number])    F/O Payor/Plan Precert #   [place] [facility] #   Brady Kim, Brady Kim [id number]   Address Phone   [address] [place], [place] [address] [phone]      Legal Guardian:     Primary Care Provider:  Patient, No Pcp Per  Current Outpatient Providers:    Psychiatrist:     Counselor/Therapist:     Compliant with Medications:  No  Additional Information:   Brady Kim 6/25/20201:39 AM

## 2018-10-20 NOTE — H&P (Signed)
[facility] Provider Admission PAA/H&P  Patient Identification: Brady Kim MRN:  [id number] Date of Evaluation:  10/20/2018 Chief Complaint:  Substance use disorder Mood disorder Principal Diagnosis: <principal problem not specified> Diagnosis:  Active Problems:   Substance induced mood disorder ([place])  History of Present Illness:   Brady Kim is an 53 y.o. male presenting under IVC to [facility] after mother petitioned him due to alcohol and drug usage. Patient denied SI, HI and psychosis. Patient reported "I don't know why I am here, my mother did this". Patient was last inpatient in 2016 due to SI. Patient denied prior suicide attempts and self-harming behaviors. Patient denied receiving any outpatient mental health services. Patient reported drinking a 6pk of beer today. Patient resides with mother and brother. Patient reported needing help because of his age and no other reason. Patient exhibited slurred speech and appeared to be intoxicated. Clinician informed AC and NP of patient mental status. Patient was cooperative during assessment.  PER IVC: Respondent has been committed in the past to [place]. In the past he has been on medication but not now. Respondent abuses alcohol and some drugs, the drugs are unknown. Respondent walks up and down the street talking to people and to himself. Respondent has been threatening to his mother by getting in her face and communicating threats.    On evaluation patient is alert and oriented x 4, pleasant, and cooperative. Speech is garbled. Mood is depressed and affect is congruent with mood. Thought process is coherent and thought content is logical. Denies suicidal ideations. Denies homicidal ideations. Endorse alcohol use, reports that he drank a six pack of beer today. States that he drinks beer a few days a week, unable to provide a clear response at this time. Denies use of other substances. Denies audiovisual hallucinations. No indication that  patient is responding to internal stimuli. When asked about his mother stating that he talks to himself, he stated "yes, I do talk to myself man. People just be getting on my nerves so I just talk to myself."  Associated Signs/Symptoms: Depression Symptoms:  depressed mood, (Hypo) Manic Symptoms:  Impulsivity, Anxiety Symptoms:  denies Psychotic Symptoms:  denies PTSD Symptoms: Negative Total Time spent with patient: 30 minutes  Past Psychiatric History: Alcohol use Disorder, MDD, three previous admissions, last at [facility] in 2016  Is the patient at risk to self? Yes.    Has the patient been a risk to self in the past 6 months? Yes.    Has the patient been a risk to self within the distant past? Yes.    Is the patient a risk to others? No.  Has the patient been a risk to others in the past 6 months? No.  Has the patient been a risk to others within the distant past? No.   Prior Inpatient Therapy:   Prior Outpatient Therapy:    Alcohol Screening: 1. How often do you have a drink containing alcohol?: 2 to 3 times a week 2. How many drinks containing alcohol do you have on a typical day when you are drinking?: 3 or 4 3. How often do you have six or more drinks on one occasion?: Less than monthly AUDIT-C Score: 5 4. How often during the last year have you found that you were not able to stop drinking once you had started?: Never 5. How often during the last year have you failed to do what was normally expected from you becasue of drinking?: Never 6. How often during  the last year have you needed a first drink in the morning to get yourself going after a heavy drinking session?: Never 7. How often during the last year have you had a feeling of guilt of remorse after drinking?: Never 8. How often during the last year have you been unable to remember what happened the night before because you had been drinking?: Never 9. Have you or someone else been injured as a result of your drinking?:  No 10. Has a relative or friend or a doctor or another health worker been concerned about your drinking or suggested you cut down?: No Alcohol Use Disorder Identification Test Final Score (AUDIT): 5 Substance Abuse History in the last 12 months:  Yes.   Consequences of Substance Abuse: Negative Previous Psychotropic Medications: Yes  Psychological Evaluations: Yes  Past Medical History:  Past Medical History:  Diagnosis Date  . Back pain   . Degenerative disc disease     Past Surgical History:  Procedure Laterality Date  . ANKLE FUSION    . KNEE ARTHROSCOPY     Family History:  Family History  Problem Relation Age of Onset  . Hypertension Mother   . Diabetes Maternal Aunt   . Cancer Maternal Aunt   . Cancer Maternal Grandmother    Family Psychiatric History: no pertinent history Tobacco Screening:   Social History:  Social History   Substance and Sexual Activity  Alcohol Use Yes   Comment: Occasional     Social History   Substance and Sexual Activity  Drug Use No    Additional Social History:                           Allergies:  No Known Allergies Lab Results: No results found for this or any previous visit (from the past 48 hour(s)).  Blood Alcohol level:  Lab Results  Component Value Date   ETH 22 (H) 09/17/2016   ETH 143 (H) 02/21/2015    Metabolic Disorder Labs:  Lab Results  Component Value Date   HGBA1C 5.80 08/24/2014   MPG 128 (H) 01/10/2014   MPG 123 (H) 03/14/2013   No results found for: PROLACTIN Lab Results  Component Value Date   CHOL 101 03/14/2013   TRIG 102 03/14/2013   HDL 46 03/14/2013   CHOLHDL 2.2 03/14/2013   VLDL 20 03/14/2013   LDLCALC 35 03/14/2013    Current Medications: Current Facility-Administered Medications  Medication Dose Route Frequency Provider Last Rate Last Dose  . acetaminophen (TYLENOL) tablet 650 mg  650 mg Oral Q6H PRN Jackelyn PolingBerry, Chundra Sauerwein A, NP      . alum & mag hydroxide-simeth (MAALOX/MYLANTA)  200-200-20 MG/5ML suspension 30 mL  30 mL Oral Q4H PRN Nira ConnBerry, Bettylou Frew A, NP      . hydrOXYzine (ATARAX/VISTARIL) tablet 25 mg  25 mg Oral Q6H PRN Nira ConnBerry, Michio Thier A, NP      . loperamide (IMODIUM) capsule 2-4 mg  2-4 mg Oral PRN Nira ConnBerry, Adric Wrede A, NP      . LORazepam (ATIVAN) tablet 1 mg  1 mg Oral Q6H PRN Nira ConnBerry, Mayeli Bornhorst A, NP      . LORazepam (ATIVAN) tablet 1 mg  1 mg Oral QID Jackelyn PolingBerry, Janifer Gieselman A, NP       Followed by  . [START ON 10/21/2018] LORazepam (ATIVAN) tablet 1 mg  1 mg Oral TID Jackelyn PolingBerry, Calvina Liptak A, NP       Followed by  . [START ON 10/22/2018] LORazepam (  ATIVAN) tablet 1 mg  1 mg Oral BID Jackelyn PolingBerry, Finlee Milo A, NP       Followed by  . [START ON 10/23/2018] LORazepam (ATIVAN) tablet 1 mg  1 mg Oral Daily Nira ConnBerry, Emiliano Welshans A, NP      . magnesium hydroxide (MILK OF MAGNESIA) suspension 30 mL  30 mL Oral Daily PRN Nira ConnBerry, Sunday Klos A, NP      . multivitamin with minerals tablet 1 tablet  1 tablet Oral Daily Nira ConnBerry, Abdulkadir Emmanuel A, NP      . ondansetron (ZOFRAN-ODT) disintegrating tablet 4 mg  4 mg Oral Q6H PRN Jackelyn PolingBerry, Cassidie Veiga A, NP      . Melene Muller[START ON 10/21/2018] thiamine (VITAMIN B-1) tablet 100 mg  100 mg Oral Daily Nira ConnBerry, Jatavis Malek A, NP       PTA Medications: Medications Prior to Admission  Medication Sig Dispense Refill Last Dose  . acetaminophen (TYLENOL) 325 MG tablet Take 650 mg by mouth every 6 (six) hours as needed for moderate pain.     Marland Kitchen. FLUoxetine (PROZAC) 10 MG capsule Take 1 capsule (10 mg total) by mouth daily. (Patient not taking: Reported on 09/17/2016) 30 capsule 0   . lidocaine (LIDODERM) 5 % Place 1 patch onto the skin daily. Remove & Discard patch within 12 hours or as directed by MD 30 patch 0   . methocarbamol (ROBAXIN) 500 MG tablet Take 1 tablet (500 mg total) by mouth 2 (two) times daily. 20 tablet 0   . pantoprazole (PROTONIX) 20 MG tablet Take 1 tablet (20 mg total) by mouth daily. (Patient not taking: Reported on 09/17/2016) 30 tablet 0   . silver sulfADIAZINE (SILVADENE) 1 % cream Apply 1 application topically  daily. 50 g 0     Musculoskeletal: Strength & Muscle Tone: within normal limits Gait & Station: unsteady Patient leans: Front  Psychiatric Specialty Exam: Physical Exam  Constitutional: He is oriented to person, place, and time. He appears well-developed and well-nourished. No distress.  HENT:  Head: Normocephalic and atraumatic.  Right Ear: External ear normal.  Left Ear: External ear normal.  Eyes: Pupils are equal, round, and reactive to light. Right eye exhibits no discharge. Left eye exhibits no discharge.  Respiratory: Effort normal. No respiratory distress.  Musculoskeletal: Normal range of motion.  Neurological: He is alert and oriented to person, place, and time.  Skin: He is not diaphoretic.  Psychiatric: He exhibits a depressed mood.    Review of Systems  Constitutional: Negative for chills, diaphoresis, fever, malaise/fatigue and weight loss.  Respiratory: Negative for cough and shortness of breath.   Cardiovascular: Negative for chest pain.  Gastrointestinal: Negative for diarrhea, nausea and vomiting.  Neurological: Negative for dizziness.  Psychiatric/Behavioral: Positive for depression and substance abuse. Negative for hallucinations, memory loss and suicidal ideas. The patient is not nervous/anxious and does not have insomnia.     There were no vitals taken for this visit.There is no height or weight on file to calculate BMI.  General Appearance: Casual and Disheveled  Eye Contact:  Fair  Speech:  Garbled and Normal Rate  Volume:  Normal  Mood:  Depressed  Affect:  Congruent and Depressed  Thought Process:  Coherent  Orientation:  Full (Time, Place, and Person)  Thought Content:  Logical and Hallucinations: None  Suicidal Thoughts:  No  Homicidal Thoughts:  No  Memory:  Immediate;   Fair Recent;   Fair  Judgement:  Impaired  Insight:  Lacking  Psychomotor Activity:  Normal  Concentration:  Concentration: Fair  and Attention Span: Fair  Recall:  Eastman KodakFair   Fund of Knowledge:  Fair  Language:  Fair  Akathisia:  Negative  Handed:  Right  AIMS (if indicated):     Assets:  Housing Intimacy Leisure Time Physical Health  ADL's:  Intact  Cognition:  WNL  Sleep:         Treatment Plan Summary: Daily contact with patient to assess and evaluate symptoms and progress in treatment and Medication management  Observation Level/Precautions:  Detox 15 minute checks Laboratory:  CBC Chemistry Profile UDS Psychotherapy:  Individual Medications:  Ativan CIWA protocol Consultations:  Peer support Discharge Concerns:   Estimated LOS: Other:      Jackelyn PolingJason A Jesselle Laflamme, NP 6/25/20201:26 AM

## 2018-10-20 NOTE — BH Assessment (Signed)
Assessment Note  Brady Kim is an 53 y.o. male presenting under IVC to [facility] after mother petitioned him due to alcohol and drug usage. Patient denied SI, HI and psychosis. Patient reported "I don't know why I am here, my mother did this". Patient was last inpatient in 2016 due to SI. Patient denied prior suicide attempts and self-harming behaviors. Patient denied receiving any outpatient mental health services. Patient reported drinking a 6pk of beer today. Patient resides with mother and brother. Patient reported needing help because of his age and no other reason. Patient exhibited slurred speech and appeared to be intoxicated. Clinician informed AC and NP of patient mental status. Patient was cooperative during assessment.  PER IVC: Respondent has been committed in the past to [place]. In the past he has been on medication but not now. Respondent abuses alcohol and some drugs, the drugs are unknown. Respondent walks up and down the street talking to people and to himself. Respondent has been threatening to his mother by getting in her face and communicating threats.   Collateral Contact: Varney Daily, mother, [phone], unable to make contact  Diagnosis: Alcohol dependence  Past Medical History:  Past Medical History:  Diagnosis Date  . Back pain   . Degenerative disc disease     Past Surgical History:  Procedure Laterality Date  . ANKLE FUSION    . KNEE ARTHROSCOPY      Family History:  Family History  Problem Relation Age of Onset  . Hypertension Mother   . Diabetes Maternal Aunt   . Cancer Maternal Aunt   . Cancer Maternal Grandmother     Social History:  reports that he has been smoking cigarettes. He has been smoking about 0.50 packs per day. He has never used smokeless tobacco. He reports current alcohol use. He reports that he does not use drugs.  Additional Social History:  Alcohol / Drug Use Pain Medications: see MAR Prescriptions: see MAR Over the Counter: see  MAR  CIWA: CIWA-Ar BP: (!) 164/95 Pulse Rate: (!) 116 COWS:    Allergies: No Known Allergies  Home Medications: (Not in a hospital admission)   OB/GYN Status:  No LMP for male patient.  General Assessment Data Location of Assessment: [facility] Assessment Services TTS Assessment: In system Is this a Tele or Face-to-Face Assessment?: Face-to-Face Is this an Initial Assessment or a Re-assessment for this encounter?: Initial Assessment Patient Accompanied by:: N/A Language Other than English: No Living Arrangements: (family home) What gender do you identify as?: Male Marital status: Single Living Arrangements: Parent Can pt return to current living arrangement?: Yes Admission Status: Involuntary Petitioner: Family member Is patient capable of signing voluntary admission?: (IVC) Referral Source: Self/Family/Friend     Crisis Care Plan Living Arrangements: Parent Legal Guardian: (self) Name of Psychiatrist: (none) Name of Therapist: (none)  Education Status Is patient currently in school?: No Is the patient employed, unemployed or receiving disability?: Unemployed  Risk to self with the past 6 months Suicidal Ideation: No Has patient been a risk to self within the past 6 months prior to admission? : No Suicidal Intent: No Has patient had any suicidal intent within the past 6 months prior to admission? : No Is patient at risk for suicide?: No Suicidal Plan?: No Has patient had any suicidal plan within the past 6 months prior to admission? : No Access to Means: No What has been your use of drugs/alcohol within the last 12 months?: (alcohol) Previous Attempts/Gestures: No How many times?: (0) Triggers for  Past Attempts: (n/a) Intentional Self Injurious Behavior: None Family Suicide History: No Recent stressful life event(s): (denied) Persecutory voices/beliefs?: No Depression: No Depression Symptoms: (denied) Substance abuse history and/or treatment for substance  abuse?: No Suicide prevention information given to non-admitted patients: Not applicable  Risk to Others within the past 6 months Homicidal Ideation: No Does patient have any lifetime risk of violence toward others beyond the six months prior to admission? : No Thoughts of Harm to Others: No Current Homicidal Intent: No Current Homicidal Plan: No Access to Homicidal Means: No Identified Victim: (n/a) History of harm to others?: No Assessment of Violence: None Noted Violent Behavior Description: (denied) Does patient have access to weapons?: No Criminal Charges Pending?: No Does patient have a court date: No Is patient on probation?: No  Psychosis Hallucinations: (denied) Delusions: (denied)  Mental Status Report Appearance/Hygiene: Unremarkable Eye Contact: Fair Motor Activity: Freedom of movement Speech: Slurred Level of Consciousness: Alert Mood: Pleasant Affect: Appropriate to circumstance Anxiety Level: Minimal Thought Processes: Relevant Judgement: Partial Orientation: Person, Place, Time, Situation Obsessive Compulsive Thoughts/Behaviors: None  Cognitive Functioning Concentration: Fair Memory: Recent Intact Is patient IDD: No Insight: Fair Impulse Control: Poor Appetite: Fair Have you had any weight changes? : No Change Sleep: No Change Total Hours of Sleep: (normal) Vegetative Symptoms: None  ADLScreening [facility] Assessment Services) Patient's cognitive ability adequate to safely complete daily activities?: Yes Patient able to express need for assistance with ADLs?: Yes Independently performs ADLs?: Yes (appropriate for developmental age)  Prior Inpatient Therapy Prior Inpatient Therapy: Yes Prior Therapy Dates: (2016) [facility] Reason for Treatment: (SI)  Prior Outpatient Therapy Prior Outpatient Therapy: No Does patient have an ACCT team?: No Does patient have Intensive In-House Services?  : No Does patient have  [facility] services? : No Does patient have P4CC services?: No  ADL Screening (condition at time of admission) Patient's cognitive ability adequate to safely complete daily activities?: Yes Patient able to express need for assistance with ADLs?: Yes Independently performs ADLs?: Yes (appropriate for developmental age)   Disposition:  Disposition Initial Assessment Completed for this Encounter: Yes  Nira ConnJason Berry, NP, recommends overnight observation for stabilization and safety with psych consult in the a.m. Hassie BruceKim, AC and RN informed of disposition.   On Site Evaluation by:   Reviewed with Physician:    Burnetta SabinLatisha D Maame Dack 10/20/2018 12:56 AM

## 2018-10-20 NOTE — Progress Notes (Signed)
UA collected. Pending results.  

## 2018-10-20 NOTE — Discharge Instructions (Addendum)
For your behavioral health needs, you are advised to follow up with [facility].  They offer substance abuse counseling.  Call them at your earliest opportunity to schedule an intake appointment:       [facility]      [place], [place] [address]      [phone]

## 2018-10-20 NOTE — Progress Notes (Addendum)
Brady Kim is an 53 y.o. male presenting under IVC to [facility] after mother petitioned him due to alcohol and drug usage. Pt denies SI/HI/AVH. Pt denies legal issues. Pt denies drug-use. Pt states he "drink some beers today".Patient states "I don't know why I am here, my mother did this". Pt states he wanted pain medication for his knees and that he wanted to go to sleep. Skin was assessed and found to be clear of any abnormal marks apart from old scars to neck and left forearm. Pt searched and no contraband found, POC and unit policies explained and understanding verbalized. Consents obtained. Food and fluids offered, and both accepted. Pt had no additional questions or concerns. Belongings in locker #4. Mask was given but not worn.

## 2018-10-20 NOTE — Progress Notes (Signed)
[place] NOVEL CORONAVIRUS (COVID-19) DAILY CHECK-OFF SYMPTOMS - answer yes or no to each - every day NO YES  Have you had a fever in the past 24 hours?  . Fever (Temp > 37.80C / 100F) X   Have you had any of these symptoms in the past 24 hours? . New Cough .  Sore Throat  .  Shortness of Breath .  Difficulty Breathing .  Unexplained Body Aches   X   Have you had any one of these symptoms in the past 24 hours not related to allergies?   . Runny Nose .  Nasal Congestion .  Sneezing   X   If you have had runny nose, nasal congestion, sneezing in the past 24 hours, has it worsened?  X   EXPOSURES - check yes or no X   Have you traveled outside the state in the past 14 days?  X   Have you been in contact with someone with a confirmed diagnosis of COVID-19 or PUI in the past 14 days without wearing appropriate PPE?  X   Have you been living in the same home as a person with confirmed diagnosis of COVID-19 or a PUI (household contact)?    X   Have you been diagnosed with COVID-19?    X              What to do next: Answered NO to all: Answered YES to anything:   Proceed with unit schedule Follow the [facility] Inpatient Flowsheet.   

## 2019-05-10 ENCOUNTER — Ambulatory Visit: Payer: Self-pay | Attending: Internal Medicine

## 2019-05-10 DIAGNOSIS — Z20822 Contact with and (suspected) exposure to covid-19: Secondary | ICD-10-CM | POA: Insufficient documentation

## 2019-05-11 LAB — NOVEL CORONAVIRUS, NAA: SARS-CoV-2, NAA: NOT DETECTED

## 2019-08-01 ENCOUNTER — Other Ambulatory Visit: Payer: Self-pay

## 2019-08-01 ENCOUNTER — Ambulatory Visit: Payer: Self-pay | Attending: Internal Medicine | Admitting: Internal Medicine

## 2019-11-06 ENCOUNTER — Emergency Department (HOSPITAL_COMMUNITY)
Admission: EM | Admit: 2019-11-06 | Discharge: 2019-11-07 | Disposition: A | Discharge status: Home / Self Care | Payer: Self-pay | Attending: Emergency Medicine | Admitting: Emergency Medicine

## 2019-11-06 ENCOUNTER — Encounter (HOSPITAL_COMMUNITY): Payer: Self-pay

## 2019-11-06 DIAGNOSIS — W1839XA Other fall on same level, initial encounter: Secondary | ICD-10-CM | POA: Insufficient documentation

## 2019-11-06 DIAGNOSIS — S299XXA Unspecified injury of thorax, initial encounter: Secondary | ICD-10-CM | POA: Insufficient documentation

## 2019-11-06 DIAGNOSIS — Z5321 Procedure and treatment not carried out due to patient leaving prior to being seen by health care provider: Secondary | ICD-10-CM | POA: Insufficient documentation

## 2019-11-06 DIAGNOSIS — Y929 Unspecified place or not applicable: Secondary | ICD-10-CM | POA: Insufficient documentation

## 2019-11-06 DIAGNOSIS — Y999 Unspecified external cause status: Secondary | ICD-10-CM | POA: Insufficient documentation

## 2019-11-06 DIAGNOSIS — Y939 Activity, unspecified: Secondary | ICD-10-CM | POA: Insufficient documentation

## 2019-11-06 NOTE — ED Triage Notes (Signed)
Arrived POV from home. Patient reports he injured right ribs after falling.

## 2019-11-07 ENCOUNTER — Emergency Department (HOSPITAL_COMMUNITY): Payer: Self-pay

## 2019-12-07 ENCOUNTER — Encounter (HOSPITAL_COMMUNITY): Payer: Self-pay | Admitting: *Deleted

## 2019-12-07 ENCOUNTER — Emergency Department (HOSPITAL_COMMUNITY)
Admission: EM | Admit: 2019-12-07 | Discharge: 2019-12-08 | Disposition: A | Discharge status: Home / Self Care | Payer: Self-pay | Attending: Emergency Medicine | Admitting: Emergency Medicine

## 2019-12-07 ENCOUNTER — Other Ambulatory Visit: Payer: Self-pay

## 2019-12-07 DIAGNOSIS — Y999 Unspecified external cause status: Secondary | ICD-10-CM | POA: Insufficient documentation

## 2019-12-07 DIAGNOSIS — W108XXA Fall (on) (from) other stairs and steps, initial encounter: Secondary | ICD-10-CM | POA: Insufficient documentation

## 2019-12-07 DIAGNOSIS — Y9389 Activity, other specified: Secondary | ICD-10-CM | POA: Insufficient documentation

## 2019-12-07 DIAGNOSIS — Y9289 Other specified places as the place of occurrence of the external cause: Secondary | ICD-10-CM | POA: Insufficient documentation

## 2019-12-07 DIAGNOSIS — S2241XA Multiple fractures of ribs, right side, initial encounter for closed fracture: Secondary | ICD-10-CM | POA: Insufficient documentation

## 2019-12-07 DIAGNOSIS — F17210 Nicotine dependence, cigarettes, uncomplicated: Secondary | ICD-10-CM | POA: Insufficient documentation

## 2019-12-07 DIAGNOSIS — W19XXXA Unspecified fall, initial encounter: Secondary | ICD-10-CM

## 2019-12-07 NOTE — ED Triage Notes (Signed)
Pt fell 3 weeks ago and was seen at [facility] but left before receiving results. Reports increased alcohol intake to help with the pain. Previous chest xray shows R sided rib fracture

## 2019-12-08 NOTE — ED Provider Notes (Signed)
[facility] EMERGENCY DEPARTMENT Provider Note   CSN: [id number] Arrival date & time: 12/07/19  2218     History Chief Complaint  Patient presents with  . Fall    Brady Kim is a 54 y.o. male.  HPI 54 yo male fell 4 weeks ago going down steps 3-4.  Complaining of ongoing right rib pain since.  States seen at [facility] but did not wait for results.  Pain is ongoing in the right lateral chest wall.  Pain with deep inspiration and coughing prevents deep breathing.  Did not strike head and denies other injuries.  Patient drinks alcohol and was drinking at time.  Patient using topical creme for pain and heating pads with some relief at time. Patient had covid in May.  He was vaccinated with J&J in April.     Past Medical History:  Diagnosis Date  . Back pain   . Degenerative disc disease     Patient Active Problem List   Diagnosis Date Noted  . Substance induced mood disorder (HCC)   . Alcohol use disorder, severe, dependence (HCC) 02/21/2015  . Severe major depression, single episode, without psychotic features ([facility]) 02/21/2015  . Right knee pain 06/27/2013  . Smoking 06/27/2013  . Chronic low back pain 03/16/2013    Past Surgical History:  Procedure Laterality Date  . ANKLE FUSION    . KNEE ARTHROSCOPY         Family History  Problem Relation Age of Onset  . Hypertension Mother   . Diabetes Maternal Aunt   . Cancer Maternal Aunt   . Cancer Maternal Grandmother     Social History   Tobacco Use  . Smoking status: Current Every Day Smoker    Packs/day: 0.50    Types: Cigarettes  . Smokeless tobacco: Never Used  Substance Use Topics  . Alcohol use: Yes    Comment: Occasional  . Drug use: No    Home Medications Prior to Admission medications   Not on File    Allergies    Patient has no known allergies.  Review of Systems   Review of Systems  All other systems reviewed and are negative.   Physical Exam Updated Vital Signs BP 130/77    Pulse 88   Temp 97.7 F (36.5 C) (Oral)   Resp 18   SpO2 98%   Physical Exam Vitals reviewed.  Constitutional:      General: He is not in acute distress.    Appearance: Normal appearance. He is normal weight.  HENT:     Head: Normocephalic.     Right Ear: External ear normal.     Left Ear: External ear normal.     Nose: Nose normal.     Mouth/Throat:     Mouth: Mucous membranes are moist.  Eyes:     Pupils: Pupils are equal, round, and reactive to light.  Cardiovascular:     Rate and Rhythm: Normal rate. Rhythm irregular.     Pulses: Normal pulses.     Heart sounds: Normal heart sounds.  Pulmonary:     Effort: Pulmonary effort is normal.     Comments: ttp right lateral rib c.w. rib 9 fx Musculoskeletal:     Cervical back: Normal range of motion.  Neurological:     Mental Status: He is alert.     ED Results / Procedures / Treatments   Labs (all labs ordered are listed, but only abnormal results are displayed) Labs Reviewed - No data  to display  EKG None  Radiology No results found.  Reviewed rib x-Oluwaferanmi Wain from July 14 and cw 8 and 9 rib fx on right Patietn with good breath sounds Plan continue current regimen Will add IS, patient not candidate for nsaid or acetaminophen due to etoh abuse Procedures Procedures (including critical care time)  Medications Ordered in ED Medications - No data to display  ED Course  I have reviewed the triage vital signs and the nursing notes.  Pertinent labs & imaging results that were available during my care of the patient were reviewed by me and considered in my medical decision making (see chart for details).    MDM Rules/Calculators/A&P                          Final Clinical Impression(s) / ED Diagnoses Final diagnoses:  Fall, initial encounter  Closed fracture of multiple ribs of right side, initial encounter    Rx / DC Orders ED Discharge Orders    None       Margarita Grizzle, MD 12/08/19 [phone]

## 2019-12-08 NOTE — ED Notes (Signed)
Pt called for vitals; no response.  

## 2019-12-08 NOTE — ED Notes (Signed)
Incentive Spirometer given to pt. Instructions given. Pt demonstrated proper use.

## 2020-02-07 ENCOUNTER — Other Ambulatory Visit: Payer: Self-pay

## 2020-02-07 ENCOUNTER — Encounter (HOSPITAL_COMMUNITY): Payer: Self-pay

## 2020-02-07 ENCOUNTER — Emergency Department (HOSPITAL_COMMUNITY)
Admission: EM | Admit: 2020-02-07 | Discharge: 2020-02-07 | Disposition: A | Discharge status: Home / Self Care | Payer: Self-pay | Attending: Emergency Medicine | Admitting: Emergency Medicine

## 2020-02-07 DIAGNOSIS — Z7289 Other problems related to lifestyle: Secondary | ICD-10-CM

## 2020-02-07 DIAGNOSIS — F10129 Alcohol abuse with intoxication, unspecified: Secondary | ICD-10-CM | POA: Insufficient documentation

## 2020-02-07 DIAGNOSIS — F17210 Nicotine dependence, cigarettes, uncomplicated: Secondary | ICD-10-CM | POA: Insufficient documentation

## 2020-02-07 DIAGNOSIS — Z789 Other specified health status: Secondary | ICD-10-CM

## 2020-02-07 NOTE — Discharge Instructions (Addendum)
Patient may return to the ER for any new or concerning symptoms.  Is medically cleared at this time.  He is mildly intoxicated but able to ambulate, able answer questions appropriately and follow commands.  He is appropriate for discharge to jail in observation at that time by non-medical individuals.

## 2020-02-07 NOTE — ED Triage Notes (Signed)
Patient arrived with [employer] who states he needs to be medically cleared to go to jail due to being intoxicated.

## 2020-02-07 NOTE — ED Provider Notes (Signed)
[facility]-EMERGENCY DEPT Provider Note   CSN: [id number] Arrival date & time: 02/07/20  1957     History Chief Complaint  Patient presents with  . Medical Clearance    Brady Kim is a 54 y.o. male.  HPI Patient is a 54 year old male with past medical history of substance-induced mood disorder, alcohol use, chronic smoker, chronic right knee pain.  Patient is presented today accompanied by [employer] for public intoxication.  Patient is able answer questions appropriately follow commands.  He states he has no complaints.  He states he does not feel off balance denies any chest pain, shortness of breath, lightheadedness, dizziness or disequilibrium.  He has no complaints at this time although he does state that his knee hurts he states that this is a chronic problem.  He has taken no pain medication today.  Denies any recreational drug use.  States he drank 2x 40 oz beers prior to coming to the ER.    Past Medical History:  Diagnosis Date  . Back pain   . Degenerative disc disease     Patient Active Problem List   Diagnosis Date Noted  . Substance induced mood disorder (HCC)   . Alcohol use disorder, severe, dependence (HCC) 02/21/2015  . Severe major depression, single episode, without psychotic features (HCC) 02/21/2015  . Right knee pain 06/27/2013  . Smoking 06/27/2013  . Chronic low back pain 03/16/2013    Past Surgical History:  Procedure Laterality Date  . ANKLE FUSION    . KNEE ARTHROSCOPY         Family History  Problem Relation Age of Onset  . Hypertension Mother   . Diabetes Maternal Aunt   . Cancer Maternal Aunt   . Cancer Maternal Grandmother     Social History   Tobacco Use  . Smoking status: Current Every Day Smoker    Packs/day: 0.50    Types: Cigarettes  . Smokeless tobacco: Never Used  Substance Use Topics  . Alcohol use: Yes    Comment: Occasional  . Drug use: No    Home Medications Prior to Admission medications    Not on File    Allergies    Patient has no known allergies.  Review of Systems   Review of Systems  Constitutional: Negative for chills and fever.  HENT: Negative for congestion.   Respiratory: Negative for shortness of breath.   Cardiovascular: Negative for chest pain.  Gastrointestinal: Negative for abdominal pain.  Musculoskeletal: Negative for neck pain.  Psychiatric/Behavioral:       Alcohol intoxication    Physical Exam Updated Vital Signs BP 125/88 (BP Location: Right Arm)   Pulse 88   Temp 98.3 F (36.8 C) (Oral)   Resp 18   SpO2 96%   Physical Exam Vitals and nursing note reviewed.  Constitutional:      General: He is not in acute distress. HENT:     Head: Normocephalic and atraumatic.     Nose: Nose normal.  Eyes:     General: No scleral icterus. Cardiovascular:     Rate and Rhythm: Normal rate and regular rhythm.     Pulses: Normal pulses.     Heart sounds: Normal heart sounds.  Pulmonary:     Effort: Pulmonary effort is normal. No respiratory distress.     Breath sounds: No wheezing.  Abdominal:     Palpations: Abdomen is soft.     Tenderness: There is no abdominal tenderness.  Musculoskeletal:     Cervical  back: Normal range of motion.     Right lower leg: No edema.     Left lower leg: No edema.  Skin:    General: Skin is warm and dry.     Capillary Refill: Capillary refill takes less than 2 seconds.  Neurological:     Mental Status: He is alert. Mental status is at baseline.     Comments: Oriented to self, time, location not willing to answer question about president.  Ambulates without difficulty.  Finger-nose intact.  No disequilibrium evident on ambulatory trial.  Speech is clear no dysarthria.  No slurred speech.  Psychiatric:     Comments: Somewhat uncooperative with certain questions.  Mood is somewhat labile.     ED Results / Procedures / Treatments   Labs (all labs ordered are listed, but only abnormal results are  displayed) Labs Reviewed - No data to display  EKG None  Radiology No results found.  Procedures Procedures (including critical care time)  Medications Ordered in ED Medications - No data to display  ED Course  I have reviewed the triage vital signs and the nursing notes.  Pertinent labs & imaging results that were available during my care of the patient were reviewed by me and considered in my medical decision making (see chart for details).    MDM Rules/Calculators/A&P                          Patient is well-appearing 54 year old male presented today for alcohol intoxication he is brought in by [facility] prior to being brought to jail.  Evaluation patient is well-appearing he does not appear significantly intoxicated has drank 2x 40 ounce beers prior to arrival in ER.  Apart from some mild uncooperative characteristics and labile mood patient is relatively unremarkable.  He is grossly neurologically intact and is oriented although unwilling to tell me who presents.  Ambulatory without difficulty.  No negation for further work-up at this time.  Patient medically cleared and will be discharged to jail.  Final Clinical Impression(s) / ED Diagnoses Final diagnoses:  Alcohol use    Rx / DC Orders ED Discharge Orders    None       [name], [place] 02/07/20 2151    Bethann Berkshire, MD 02/09/20 1114

## 2020-07-18 ENCOUNTER — Encounter: Payer: Self-pay | Admitting: Physician Assistant

## 2020-07-18 ENCOUNTER — Other Ambulatory Visit: Payer: Self-pay

## 2020-07-18 ENCOUNTER — Ambulatory Visit: Payer: Self-pay | Attending: Physician Assistant | Admitting: Physician Assistant

## 2020-07-18 VITALS — BP 137/82 | HR 71 | Ht 70.47 in | Wt 166.8 lb

## 2020-07-18 DIAGNOSIS — Z1211 Encounter for screening for malignant neoplasm of colon: Secondary | ICD-10-CM

## 2020-07-18 DIAGNOSIS — Z131 Encounter for screening for diabetes mellitus: Secondary | ICD-10-CM

## 2020-07-18 DIAGNOSIS — R030 Elevated blood-pressure reading, without diagnosis of hypertension: Secondary | ICD-10-CM

## 2020-07-18 DIAGNOSIS — Z13220 Encounter for screening for lipoid disorders: Secondary | ICD-10-CM

## 2020-07-18 NOTE — Progress Notes (Signed)
Wants FIT test

## 2020-07-18 NOTE — Progress Notes (Signed)
Patient ID: [facility], male   DOB: 04/10/1966, 55 y.o.   MRN: [id number]   [facility] Kim, is a 55 y.o. male  WCB:[id number]  OHY:[id number]  DOB - 09-30-1965  Subjective:  Chief Complaint and HPI: [facility] Kim is a 55 y.o. male here today for a general check up.  He denies any current issues.  No insurance.  He only ate cabbage today.     ROS:   Constitutional:  No f/c, No night sweats, No unexplained weight loss. EENT:  No vision changes, No blurry vision, No hearing changes. No mouth, throat, or ear problems.  Respiratory: No cough, No SOB Cardiac: No CP, no palpitations GI:  No abd pain, No N/V/D. GU: No Urinary s/sx Musculoskeletal: No joint pain Neuro: No headache, no dizziness, no motor weakness.  Skin: No rash Endocrine:  No polydipsia. No polyuria.  Psych: Denies SI/HI  No problems updated.  ALLERGIES: No Known Allergies  PAST MEDICAL HISTORY: Past Medical History:  Diagnosis Date  . Back pain   . Degenerative disc disease     MEDICATIONS AT HOME: Prior to Admission medications   Not on File     Objective:  EXAM:   Vitals:   07/18/20 1349  BP: 137/82  Pulse: 71  SpO2: 99%  Weight: 166 lb 12.8 oz (75.7 kg)  Height: 5' 10.47" (1.79 m)    General appearance : A&OX3. NAD. Non-toxic-appearing HEENT: Atraumatic and Normocephalic.  PERRLA. EOM intact.  Chest/Lungs:  Breathing-non-labored, Good air entry bilaterally, breath sounds normal without rales, rhonchi, or wheezing  CVS: S1 S2 regular, no murmurs, gallops, rubs  Extremities: Bilateral Lower Ext shows no edema, both legs are warm to touch with = pulse throughout Neurology:  CN II-XII grossly intact, Non focal.   Psych:  TP linear. J/I WNL. Normal speech. Appropriate eye contact and affect.  Skin:  No Rash  Data Review Lab Results  Component Value Date   HGBA1C 5.80 08/24/2014   HGBA1C 6.1 (H) 01/10/2014   HGBA1C 5.9 (H) 03/14/2013     Assessment & Plan   1. Screening for diabetes  mellitus I have had a lengthy discussion and provided education about insulin resistance and the intake of too much sugar/refined carbohydrates.  I have advised the patient to work at a goal of eliminating sugary drinks, candy, desserts, sweets, refined sugars, processed foods, and white carbohydrates.  The patient expresses understanding.  - Comprehensive metabolic panel - CBC with Differential/Platelet - Hemoglobin A1c  2. Elevated blood pressure reading Check blood pressure OOO and record.   - Comprehensive metabolic panel  3. Screening cholesterol level - Comprehensive metabolic panel - Lipid panel  4. Screening for colon cancer - Fecal occult blood, imunochemical([facility])    Patient have been counseled extensively about nutrition and exercise  Return in about 6 months (around 01/18/2021) for assign PCP.  The patient was given clear instructions to go to ER or return to medical center if symptoms don't improve, worsen or new problems develop. The patient verbalized understanding. The patient was told to call to get lab results if they haven't heard anything in the next week.     Georgian Co, PA-C [facility] and [facility] [place], [place] [phone]   07/18/2020, 2:00 PM

## 2020-07-19 LAB — CBC WITH DIFFERENTIAL/PLATELET
Basophils Absolute: 0 10*3/uL (ref 0.0–0.2)
Basos: 1 %
EOS (ABSOLUTE): 0.1 10*3/uL (ref 0.0–0.4)
Eos: 1 %
Hematocrit: 44.5 % (ref 37.5–51.0)
Hemoglobin: 15 g/dL (ref 13.0–17.7)
Immature Grans (Abs): 0 10*3/uL (ref 0.0–0.1)
Immature Granulocytes: 0 %
Lymphocytes Absolute: 1.6 10*3/uL (ref 0.7–3.1)
Lymphs: 24 %
MCH: 31.6 pg (ref 26.6–33.0)
MCHC: 33.7 g/dL (ref 31.5–35.7)
MCV: 94 fL (ref 79–97)
Monocytes Absolute: 0.5 10*3/uL (ref 0.1–0.9)
Monocytes: 8 %
Neutrophils Absolute: 4.3 10*3/uL (ref 1.4–7.0)
Neutrophils: 66 %
Platelets: 228 10*3/uL (ref 150–450)
RBC: 4.75 x10E6/uL (ref 4.14–5.80)
RDW: 13.1 % (ref 11.6–15.4)
WBC: 6.6 10*3/uL (ref 3.4–10.8)

## 2020-07-19 LAB — COMPREHENSIVE METABOLIC PANEL
ALT: 57 IU/L — ABNORMAL HIGH (ref 0–44)
AST: 28 IU/L (ref 0–40)
Albumin/Globulin Ratio: 1.9 (ref 1.2–2.2)
Albumin: 4.6 g/dL (ref 3.8–4.9)
Alkaline Phosphatase: 81 IU/L (ref 44–121)
BUN/Creatinine Ratio: 10 (ref 9–20)
BUN: 9 mg/dL (ref 6–24)
Bilirubin Total: 0.5 mg/dL (ref 0.0–1.2)
CO2: 23 mmol/L (ref 20–29)
Calcium: 9.2 mg/dL (ref 8.7–10.2)
Chloride: 100 mmol/L (ref 96–106)
Creatinine, Ser: 0.89 mg/dL (ref 0.76–1.27)
Globulin, Total: 2.4 g/dL (ref 1.5–4.5)
Glucose: 96 mg/dL (ref 65–99)
Potassium: 4.8 mmol/L (ref 3.5–5.2)
Sodium: 138 mmol/L (ref 134–144)
Total Protein: 7 g/dL (ref 6.0–8.5)
eGFR: 102 mL/min/{1.73_m2} (ref >59)

## 2020-07-19 LAB — LIPID PANEL
Chol/HDL Ratio: 2.1 ratio (ref 0.0–5.0)
Cholesterol, Total: 138 mg/dL (ref 100–199)
HDL: 65 mg/dL (ref >39)
LDL Chol Calc (NIH): 46 mg/dL (ref 0–99)
Triglycerides: 163 mg/dL — ABNORMAL HIGH (ref 0–149)
VLDL Cholesterol Cal: 27 mg/dL (ref 5–40)

## 2020-07-19 LAB — HEMOGLOBIN A1C
Est. average glucose Bld gHb Est-mCnc: 114 mg/dL
Hgb A1c MFr Bld: 5.6 % (ref 4.8–5.6)

## 2020-07-21 LAB — FECAL OCCULT BLOOD, IMMUNOCHEMICAL: Fecal Occult Bld: NEGATIVE

## 2020-07-26 ENCOUNTER — Encounter: Payer: Self-pay | Admitting: *Deleted

## 2021-08-21 ENCOUNTER — Other Ambulatory Visit: Payer: Self-pay

## 2021-08-21 ENCOUNTER — Emergency Department (HOSPITAL_COMMUNITY)
Admission: EM | Admit: 2021-08-21 | Discharge: 2021-08-21 | Disposition: A | Discharge status: Home / Self Care | Payer: Medicaid Other | Attending: Emergency Medicine | Admitting: Emergency Medicine

## 2021-08-21 ENCOUNTER — Emergency Department (HOSPITAL_COMMUNITY): Payer: Medicaid Other

## 2021-08-21 ENCOUNTER — Encounter (HOSPITAL_COMMUNITY): Payer: Self-pay | Admitting: Emergency Medicine

## 2021-08-21 DIAGNOSIS — K0889 Other specified disorders of teeth and supporting structures: Secondary | ICD-10-CM | POA: Insufficient documentation

## 2021-08-21 DIAGNOSIS — R519 Headache, unspecified: Secondary | ICD-10-CM | POA: Diagnosis present

## 2021-08-21 DIAGNOSIS — M545 Low back pain, unspecified: Secondary | ICD-10-CM | POA: Insufficient documentation

## 2021-08-21 DIAGNOSIS — Z79899 Other long term (current) drug therapy: Secondary | ICD-10-CM | POA: Insufficient documentation

## 2021-08-21 DIAGNOSIS — I10 Essential (primary) hypertension: Secondary | ICD-10-CM | POA: Diagnosis not present

## 2021-08-21 LAB — BASIC METABOLIC PANEL
Anion gap: 9 (ref 5–15)
BUN: 14 mg/dL (ref 6–20)
CO2: 25 mmol/L (ref 22–32)
Calcium: 8.4 mg/dL — ABNORMAL LOW (ref 8.9–10.3)
Chloride: 104 mmol/L (ref 98–111)
Creatinine, Ser: 0.83 mg/dL (ref 0.61–1.24)
GFR, Estimated: >60 mL/min (ref >60)
Glucose, Bld: 93 mg/dL (ref 70–99)
Potassium: 3.6 mmol/L (ref 3.5–5.1)
Sodium: 138 mmol/L (ref 135–145)

## 2021-08-21 LAB — CBC WITH DIFFERENTIAL/PLATELET
Abs Immature Granulocytes: 0.02 10*3/uL (ref 0.00–0.07)
Basophils Absolute: 0.1 10*3/uL (ref 0.0–0.1)
Basophils Relative: 1 %
Eosinophils Absolute: 0.2 10*3/uL (ref 0.0–0.5)
Eosinophils Relative: 3 %
HCT: 43 % (ref 39.0–52.0)
Hemoglobin: 14.8 g/dL (ref 13.0–17.0)
Immature Granulocytes: 0 %
Lymphocytes Relative: 35 %
Lymphs Abs: 2.3 10*3/uL (ref 0.7–4.0)
MCH: 31.1 pg (ref 26.0–34.0)
MCHC: 34.4 g/dL (ref 30.0–36.0)
MCV: 90.3 fL (ref 80.0–100.0)
Monocytes Absolute: 0.5 10*3/uL (ref 0.1–1.0)
Monocytes Relative: 8 %
Neutro Abs: 3.6 10*3/uL (ref 1.7–7.7)
Neutrophils Relative %: 53 %
Platelets: 264 10*3/uL (ref 150–400)
RBC: 4.76 MIL/uL (ref 4.22–5.81)
RDW: 14.7 % (ref 11.5–15.5)
WBC: 6.8 10*3/uL (ref 4.0–10.5)
nRBC: 0 % (ref 0.0–0.2)

## 2021-08-21 MED ORDER — AMLODIPINE BESYLATE 5 MG PO TABS
5.0000 mg | ORAL_TABLET | Freq: Once | ORAL | Status: AC
  Administered 2021-08-21: 5 mg via ORAL
  Filled 2021-08-21: qty 1

## 2021-08-21 MED ORDER — AMLODIPINE BESYLATE 5 MG PO TABS: 5.0000 mg | ORAL_TABLET | Freq: Every day | ORAL | 2 refills | Status: DC

## 2021-08-21 NOTE — ED Provider Notes (Signed)
?[facility]-EMERGENCY DEPT ?Provider Note ? ? ?CSN: [id number] ?Arrival date & time: 08/21/21  1826 ? ?  ? ?History ? ?Chief Complaint  ?Patient presents with  ? Dental Pain  ? Back Pain  ? Headache  ? ? ?[facility] is a 56 y.o. male. ? ?HPI ?Patient presenting for evaluation of right-sided headache, drainage from right eye, and being out of his blood pressure medicine for 2 weeks.  He states he is recently transferred here from [place] and ran out of his blood pressure medicine.  While in [place] he was also being treated for chronic back pain with "therapy."  He does not take any other medicines regularly.  Drainage from the right eye has been present for many months.  Headache has been present for several days, on and off.  He denies difficulty walking.  He is working on getting his Medicaid moved to [employer]. ?  ? ?Home Medications ?Prior to Admission medications   ?Medication Sig Start Date End Date Taking? Authorizing Provider  ?amLODipine (NORVASC) 5 MG tablet Take 1 tablet (5 mg total) by mouth daily. 08/21/21  Yes Mancel Bale, MD  ?   ? ?Allergies    ?Patient has no known allergies.   ? ?Review of Systems   ?Review of Systems ? ?Physical Exam ?Updated Vital Signs ?BP (!) 129/99   Pulse 91   Temp 98.8 ?F (37.1 ?C) (Oral)   Resp 19   SpO2 92%  ?Physical Exam ?Vitals and nursing note reviewed.  ?Constitutional:   ?   General: He is not in acute distress. ?   Appearance: He is well-developed. He is not ill-appearing, toxic-appearing or diaphoretic.  ?HENT:  ?   Head: Normocephalic and atraumatic.  ?   Right Ear: External ear normal.  ?   Left Ear: External ear normal.  ?   Mouth/Throat:  ?   Mouth: Mucous membranes are moist.  ?   Comments: No trismus.  Left upper lateral incisor, eroded to the base from large cavity.  No associated swelling of the gumline, drainage or bleeding.  No other acute dental disorder. ?Eyes:  ?   Conjunctiva/sclera: Conjunctivae normal.  ?   Pupils:  Pupils are equal, round, and reactive to light.  ?   Comments: No excessive drainage from either eye.  Conjunctiva.  Normal bilaterally.  Pupils are equal round and react to light.  EOMI.  ?Neck:  ?   Trachea: Phonation normal.  ?Cardiovascular:  ?   Rate and Rhythm: Normal rate and regular rhythm.  ?   Heart sounds: Normal heart sounds.  ?Pulmonary:  ?   Effort: Pulmonary effort is normal.  ?   Breath sounds: Normal breath sounds.  ?Abdominal:  ?   General: There is no distension.  ?   Palpations: Abdomen is soft.  ?   Tenderness: There is no abdominal tenderness.  ?Musculoskeletal:     ?   General: Normal range of motion.  ?   Cervical back: Normal range of motion and neck supple.  ?Skin: ?   General: Skin is warm and dry.  ?Neurological:  ?   Mental Status: He is alert and oriented to person, place, and time.  ?   Cranial Nerves: No cranial nerve deficit.  ?   Sensory: No sensory deficit.  ?   Motor: No abnormal muscle tone.  ?   Coordination: Coordination normal.  ?   Comments: No dysarthria, aphasia or nystagmus.  ?Psychiatric:     ?  Mood and Affect: Mood normal.     ?   Behavior: Behavior normal.     ?   Thought Content: Thought content normal.     ?   Judgment: Judgment normal.  ? ? ?ED Results / Procedures / Treatments   ?Labs ?(all labs ordered are listed, but only abnormal results are displayed) ?Labs Reviewed  ?BASIC METABOLIC PANEL - Abnormal; Notable for the following components:  ?    Result Value  ? Calcium 8.4 (*)   ? All other components within normal limits  ?CBC WITH DIFFERENTIAL/PLATELET  ? ? ?EKG ?EKG Interpretation ? ?Date/Time:  Thursday August 21 2021 19:32:11 EDT ?Ventricular Rate:  90 ?PR Interval:  137 ?QRS Duration: 88 ?QT Interval:  352 ?QTC Calculation: 431 ?R Axis:   78 ?Text Interpretation: Sinus rhythm Minimal ST elevation, inferior leads since last tracing no significant change Confirmed by Mancel BaleWentz, Joquan Lotz [phone]([id number]) on 08/21/2021 7:37:37 PM ? ?Radiology ?CT Head Wo Contrast ? ?Result  Date: 08/21/2021 ?CLINICAL DATA:  Headache EXAM: CT HEAD WITHOUT CONTRAST TECHNIQUE: Contiguous axial images were obtained from the base of the skull through the vertex without intravenous contrast. RADIATION DOSE REDUCTION: This exam was performed according to the departmental dose-optimization program which includes automated exposure control, adjustment of the mA and/or kV according to patient size and/or use of iterative reconstruction technique. COMPARISON:  Head CT dated Sep 17, 2016 FINDINGS: Brain: Old right MCA territory infarct. No evidence of acute infarction, hemorrhage, hydrocephalus, extra-axial collection or mass lesion/mass effect. Vascular: No hyperdense vessel or unexpected calcification. Skull: Normal. Negative for fracture or focal lesion. Sinuses/Orbits: No acute finding. Other: None. IMPRESSION: 1. No acute intracranial abnormality. 2. Old right MCA territory infarct. Electronically Signed   By: Allegra LaiLeah  Strickland M.D.   On: 08/21/2021 19:33   ? ?Procedures ?Procedures  ? ? ?Medications Ordered in ED ?Medications  ?amLODipine (NORVASC) tablet 5 mg (5 mg Oral Given 08/21/21 1933)  ? ? ?ED Course/ Medical Decision Making/ A&P ?  ?                        ?Medical Decision Making ?Patient presenting with headache, dental pain and being out of his blood pressure medications.  Blood pressure mild diastolic hypertension on arrival.  He is given a dose of his amlodipine.  Screening for stroke with head CT.  Screening lab work for complications of untreated blood pressure. ? ?Problems Addressed: ?Acute nonintractable headache, unspecified headache type: acute illness or injury ?Hypertension, unspecified type: chronic illness or injury ?   Details: Noncompliant with medication because he ran out ?Pain, dental: chronic illness or injury ?   Details: No recent dental care ? ?Amount and/or Complexity of Data Reviewed ?Independent Historian:  ?   Details: He is a cogent historian ?Labs: ordered. ?   Details:  Metabolic panel, CBC-normal Except calcium low ?Radiology: ordered and independent interpretation performed. ?   Details: CT head-no acute abnormalities ? ?Risk ?Prescription drug management. ?Decision not to resuscitate or to de-escalate care because of poor prognosis. ?Risk Details: Patient presenting with multiple complaints after not taking his amlodipine for 2 weeks.  He has not yet established medical care in town.  Is not had any recent dental care.  Blood pressure improved spontaneously and he was given a dose of his usual medicine.  No evidence for hypertensive urgency or intracranial bleeding/CVA.  Incidental mild dental disease, requiring outpatient management with dentist.  He does not require hospitalization or  further ED intervention at this time.  Novasc prescription given to patient.  Encourage PCP and dental follow-up. ? ? ? ? ? ? ? ? ? ? ?Final Clinical Impression(s) / ED Diagnoses ?Final diagnoses:  ?Acute nonintractable headache, unspecified headache type  ?Hypertension, unspecified type  ?Pain, dental  ? ? ?Rx / DC Orders ?ED Discharge Orders   ? ?      Ordered  ?  amLODipine (NORVASC) 5 MG tablet  Daily       ? 08/21/21 2046  ? ?  ?  ? ?  ? ? ?  ?Mancel Bale, MD ?08/21/21 2344 ? ?

## 2021-08-21 NOTE — Discharge Instructions (Addendum)
Start taking the amlodipine for blood pressure tomorrow.  Call the ophthalmologist listed for an appointment to be seen about the problem with your right eye.  Use the resource guide to help you find a dentist to see about the pain in your teeth.  In the meantime take Tylenol every 4 hours as needed for pain.  Use the resource guide to help you find a primary care doctor to see for a blood pressure check in a couple weeks. ?

## 2021-08-21 NOTE — ED Notes (Signed)
Pt transported to CT ?

## 2021-08-21 NOTE — ED Triage Notes (Signed)
Pt reports dental pain, a migraine hat has lasted all day and back pain. ?

## 2021-10-31 ENCOUNTER — Emergency Department (HOSPITAL_COMMUNITY): Payer: Medicaid Other

## 2021-10-31 ENCOUNTER — Encounter (HOSPITAL_COMMUNITY): Payer: Self-pay

## 2021-10-31 ENCOUNTER — Observation Stay (HOSPITAL_COMMUNITY)
Admission: EM | Admit: 2021-10-31 | Discharge: 2021-11-02 | Disposition: A | Discharge status: Home / Self Care | Payer: Medicaid Other | Attending: Student | Admitting: Student

## 2021-10-31 ENCOUNTER — Other Ambulatory Visit: Payer: Self-pay

## 2021-10-31 DIAGNOSIS — R55 Syncope and collapse: Secondary | ICD-10-CM | POA: Diagnosis present

## 2021-10-31 DIAGNOSIS — F17200 Nicotine dependence, unspecified, uncomplicated: Secondary | ICD-10-CM

## 2021-10-31 DIAGNOSIS — Z79899 Other long term (current) drug therapy: Secondary | ICD-10-CM | POA: Diagnosis not present

## 2021-10-31 DIAGNOSIS — E872 Acidosis, unspecified: Secondary | ICD-10-CM | POA: Diagnosis present

## 2021-10-31 DIAGNOSIS — F10939 Alcohol use, unspecified with withdrawal, unspecified: Secondary | ICD-10-CM

## 2021-10-31 DIAGNOSIS — F17210 Nicotine dependence, cigarettes, uncomplicated: Secondary | ICD-10-CM | POA: Insufficient documentation

## 2021-10-31 DIAGNOSIS — E162 Hypoglycemia, unspecified: Secondary | ICD-10-CM | POA: Diagnosis present

## 2021-10-31 DIAGNOSIS — E871 Hypo-osmolality and hyponatremia: Secondary | ICD-10-CM | POA: Diagnosis present

## 2021-10-31 DIAGNOSIS — Z8249 Family history of ischemic heart disease and other diseases of the circulatory system: Secondary | ICD-10-CM

## 2021-10-31 DIAGNOSIS — I10 Essential (primary) hypertension: Secondary | ICD-10-CM | POA: Insufficient documentation

## 2021-10-31 DIAGNOSIS — E876 Hypokalemia: Secondary | ICD-10-CM | POA: Diagnosis not present

## 2021-10-31 DIAGNOSIS — E559 Vitamin D deficiency, unspecified: Secondary | ICD-10-CM | POA: Diagnosis present

## 2021-10-31 DIAGNOSIS — F1020 Alcohol dependence, uncomplicated: Secondary | ICD-10-CM | POA: Diagnosis present

## 2021-10-31 DIAGNOSIS — F10239 Alcohol dependence with withdrawal, unspecified: Secondary | ICD-10-CM | POA: Diagnosis present

## 2021-10-31 DIAGNOSIS — Z833 Family history of diabetes mellitus: Secondary | ICD-10-CM

## 2021-10-31 DIAGNOSIS — Z720 Tobacco use: Secondary | ICD-10-CM

## 2021-10-31 DIAGNOSIS — E860 Dehydration: Secondary | ICD-10-CM | POA: Diagnosis present

## 2021-10-31 HISTORY — DX: Essential (primary) hypertension: I10

## 2021-10-31 LAB — COMPREHENSIVE METABOLIC PANEL
ALT: 20 U/L (ref 0–44)
AST: 12 U/L — ABNORMAL LOW (ref 15–41)
Albumin: 2.1 g/dL — ABNORMAL LOW (ref 3.5–5.0)
Alkaline Phosphatase: 36 U/L — ABNORMAL LOW (ref 38–126)
Anion gap: 5 (ref 5–15)
BUN: 8 mg/dL (ref 6–20)
CO2: 15 mmol/L — ABNORMAL LOW (ref 22–32)
Calcium: 4.6 mg/dL — CL (ref 8.9–10.3)
Chloride: 122 mmol/L — ABNORMAL HIGH (ref 98–111)
Creatinine, Ser: 0.47 mg/dL — ABNORMAL LOW (ref 0.61–1.24)
GFR, Estimated: >60 mL/min (ref >60)
Glucose, Bld: 51 mg/dL — ABNORMAL LOW (ref 70–99)
Potassium: 2.6 mmol/L — CL (ref 3.5–5.1)
Sodium: 142 mmol/L (ref 135–145)
Total Bilirubin: 0.6 mg/dL (ref 0.3–1.2)
Total Protein: 3.8 g/dL — ABNORMAL LOW (ref 6.5–8.1)

## 2021-10-31 LAB — CBC
HCT: 43.3 % (ref 39.0–52.0)
Hemoglobin: 14.5 g/dL (ref 13.0–17.0)
MCH: 31.9 pg (ref 26.0–34.0)
MCHC: 33.5 g/dL (ref 30.0–36.0)
MCV: 95.4 fL (ref 80.0–100.0)
Platelets: 193 10*3/uL (ref 150–400)
RBC: 4.54 MIL/uL (ref 4.22–5.81)
RDW: 14.2 % (ref 11.5–15.5)
WBC: 8.5 10*3/uL (ref 4.0–10.5)
nRBC: 0 % (ref 0.0–0.2)

## 2021-10-31 LAB — RENAL FUNCTION PANEL
Albumin: 3.8 g/dL (ref 3.5–5.0)
Anion gap: 10 (ref 5–15)
BUN: 10 mg/dL (ref 6–20)
CO2: 22 mmol/L (ref 22–32)
Calcium: 9.7 mg/dL (ref 8.9–10.3)
Chloride: 104 mmol/L (ref 98–111)
Creatinine, Ser: 0.8 mg/dL (ref 0.61–1.24)
GFR, Estimated: >60 mL/min (ref >60)
Glucose, Bld: 129 mg/dL — ABNORMAL HIGH (ref 70–99)
Phosphorus: 2.9 mg/dL (ref 2.5–4.6)
Potassium: 4 mmol/L (ref 3.5–5.1)
Sodium: 136 mmol/L (ref 135–145)

## 2021-10-31 LAB — BLOOD GAS, VENOUS
Acid-base deficit: 0.6 mmol/L (ref 0.0–2.0)
Bicarbonate: 23.2 mmol/L (ref 20.0–28.0)
O2 Saturation: 91.8 %
Patient temperature: 37
pCO2, Ven: 35 mmHg — ABNORMAL LOW (ref 44–60)
pH, Ven: 7.43 (ref 7.25–7.43)
pO2, Ven: 58 mmHg — ABNORMAL HIGH (ref 32–45)

## 2021-10-31 LAB — RAPID URINE DRUG SCREEN, HOSP PERFORMED
Amphetamines: NOT DETECTED
Barbiturates: NOT DETECTED
Benzodiazepines: NOT DETECTED
Cocaine: NOT DETECTED
Opiates: NOT DETECTED
Tetrahydrocannabinol: NOT DETECTED

## 2021-10-31 LAB — MAGNESIUM
Magnesium: 1.2 mg/dL — ABNORMAL LOW (ref 1.7–2.4)
Magnesium: 2.2 mg/dL (ref 1.7–2.4)

## 2021-10-31 LAB — GLUCOSE, CAPILLARY
Glucose-Capillary: 104 mg/dL — ABNORMAL HIGH (ref 70–99)
Glucose-Capillary: 121 mg/dL — ABNORMAL HIGH (ref 70–99)
Glucose-Capillary: 107 mg/dL — ABNORMAL HIGH (ref 70–99)
Glucose-Capillary: 101 mg/dL — ABNORMAL HIGH (ref 70–99)

## 2021-10-31 LAB — BASIC METABOLIC PANEL
Anion gap: 8 (ref 5–15)
BUN: 12 mg/dL (ref 6–20)
CO2: 24 mmol/L (ref 22–32)
Calcium: 10.1 mg/dL (ref 8.9–10.3)
Chloride: 105 mmol/L (ref 98–111)
Creatinine, Ser: 0.87 mg/dL (ref 0.61–1.24)
GFR, Estimated: >60 mL/min (ref >60)
Glucose, Bld: 108 mg/dL — ABNORMAL HIGH (ref 70–99)
Potassium: 4.2 mmol/L (ref 3.5–5.1)
Sodium: 137 mmol/L (ref 135–145)

## 2021-10-31 LAB — HIV ANTIBODY (ROUTINE TESTING W REFLEX): HIV Screen 4th Generation wRfx: NONREACTIVE

## 2021-10-31 LAB — CBG MONITORING, ED: Glucose-Capillary: 86 mg/dL (ref 70–99)

## 2021-10-31 LAB — ETHANOL: Alcohol, Ethyl (B): <10 mg/dL (ref <10)

## 2021-10-31 LAB — TROPONIN I (HIGH SENSITIVITY)
Troponin I (High Sensitivity): <2 ng/L (ref <18)
Troponin I (High Sensitivity): 4 ng/L (ref <18)

## 2021-10-31 LAB — MRSA NEXT GEN BY PCR, NASAL: MRSA by PCR Next Gen: NOT DETECTED

## 2021-10-31 LAB — PHOSPHORUS: Phosphorus: 2.7 mg/dL (ref 2.5–4.6)

## 2021-10-31 MED ORDER — CALCIUM GLUCONATE-NACL 2-0.675 GM/100ML-% IV SOLN
2.0000 g | INTRAVENOUS | Status: AC
Start: 2021-10-31 — End: 2021-10-31
  Administered 2021-10-31: 2000 mg via INTRAVENOUS
  Filled 2021-10-31: qty 100

## 2021-10-31 MED ORDER — THIAMINE HCL 100 MG PO TABS
100.0000 mg | ORAL_TABLET | Freq: Every day | ORAL | Status: DC
  Administered 2021-10-31 – 2021-11-02 (×3): 100 mg via ORAL
  Filled 2021-10-31 (×3): qty 1

## 2021-10-31 MED ORDER — ADULT MULTIVITAMIN W/MINERALS CH
1.0000 | ORAL_TABLET | Freq: Every day | ORAL | Status: DC
Start: 2021-10-31 — End: 2021-11-02
  Administered 2021-10-31 – 2021-11-02 (×3): 1 via ORAL
  Filled 2021-10-31 (×3): qty 1

## 2021-10-31 MED ORDER — ONDANSETRON HCL 4 MG/2ML IJ SOLN: 4.0000 mg | Freq: Four times a day (QID) | INTRAMUSCULAR | Status: DC | PRN

## 2021-10-31 MED ORDER — ACETAMINOPHEN 325 MG PO TABS
650.0000 mg | ORAL_TABLET | Freq: Once | ORAL | Status: AC
  Administered 2021-10-31: 650 mg via ORAL
  Filled 2021-10-31: qty 2

## 2021-10-31 MED ORDER — SODIUM CHLORIDE 0.9 % IV SOLN
1.5000 mg/kg/h | INTRAVENOUS | Status: DC
  Administered 2021-10-31: 1.5 mg/kg/h via INTRAVENOUS
  Filled 2021-10-31 (×2): qty 100

## 2021-10-31 MED ORDER — AMLODIPINE BESYLATE 5 MG PO TABS
5.0000 mg | ORAL_TABLET | Freq: Every day | ORAL | Status: DC
  Administered 2021-11-01 – 2021-11-02 (×2): 5 mg via ORAL
  Filled 2021-10-31 (×2): qty 1

## 2021-10-31 MED ORDER — CHLORHEXIDINE GLUCONATE CLOTH 2 % EX PADS
6.0000 | MEDICATED_PAD | Freq: Every day | CUTANEOUS | Status: DC
  Administered 2021-10-31 – 2021-11-01 (×2): 6 via TOPICAL

## 2021-10-31 MED ORDER — THIAMINE HCL 100 MG/ML IJ SOLN: 100.0000 mg | Freq: Every day | INTRAMUSCULAR | Status: DC

## 2021-10-31 MED ORDER — ENOXAPARIN SODIUM 40 MG/0.4ML IJ SOSY
40.0000 mg | PREFILLED_SYRINGE | INTRAMUSCULAR | Status: DC
  Administered 2021-10-31 – 2021-11-01 (×2): 40 mg via SUBCUTANEOUS
  Filled 2021-10-31 (×2): qty 0.4

## 2021-10-31 MED ORDER — SODIUM CHLORIDE 0.9 % IV SOLN
1.5000 mg/kg/h | INTRAVENOUS | Status: DC
  Administered 2021-10-31: 1.5 mg/kg/h via INTRAVENOUS
  Filled 2021-10-31 (×4): qty 100

## 2021-10-31 MED ORDER — LORAZEPAM 1 MG PO TABS
1.0000 mg | ORAL_TABLET | ORAL | Status: DC | PRN
  Administered 2021-10-31: 1 mg via ORAL
  Filled 2021-10-31: qty 1

## 2021-10-31 MED ORDER — MAGNESIUM SULFATE 2 GM/50ML IV SOLN
2.0000 g | Freq: Once | INTRAVENOUS | Status: AC
Start: 2021-10-31 — End: 2021-10-31
  Administered 2021-10-31: 2 g via INTRAVENOUS
  Filled 2021-10-31: qty 50

## 2021-10-31 MED ORDER — IBUPROFEN 200 MG PO TABS
400.0000 mg | ORAL_TABLET | Freq: Four times a day (QID) | ORAL | Status: DC | PRN
  Administered 2021-10-31 – 2021-11-01 (×4): 400 mg via ORAL
  Filled 2021-10-31 (×4): qty 2

## 2021-10-31 MED ORDER — POTASSIUM CHLORIDE 10 MEQ/100ML IV SOLN
10.0000 meq | Freq: Once | INTRAVENOUS | Status: AC
  Administered 2021-10-31: 10 meq via INTRAVENOUS
  Filled 2021-10-31: qty 100

## 2021-10-31 MED ORDER — LACTATED RINGERS IV BOLUS
1000.0000 mL | Freq: Once | INTRAVENOUS | Status: AC
  Administered 2021-10-31: 1000 mL via INTRAVENOUS

## 2021-10-31 MED ORDER — OXYCODONE HCL 5 MG PO TABS
5.0000 mg | ORAL_TABLET | ORAL | Status: DC | PRN
  Administered 2021-10-31 – 2021-11-02 (×5): 5 mg via ORAL
  Filled 2021-10-31 (×6): qty 1

## 2021-10-31 MED ORDER — LORAZEPAM 2 MG/ML IJ SOLN: 0.0000 mg | Freq: Three times a day (TID) | INTRAMUSCULAR | Status: DC

## 2021-10-31 MED ORDER — POTASSIUM CHLORIDE 20 MEQ PO PACK
60.0000 meq | PACK | Freq: Two times a day (BID) | ORAL | Status: DC
  Administered 2021-10-31 (×2): 60 meq via ORAL
  Filled 2021-10-31 (×2): qty 3

## 2021-10-31 MED ORDER — ORAL CARE MOUTH RINSE: 15.0000 mL | OROMUCOSAL | Status: DC | PRN

## 2021-10-31 MED ORDER — ONDANSETRON HCL 4 MG PO TABS: 4.0000 mg | ORAL_TABLET | Freq: Four times a day (QID) | ORAL | Status: DC | PRN

## 2021-10-31 MED ORDER — DEXTROSE-NACL 5-0.45 % IV SOLN
INTRAVENOUS | Status: DC
Start: 2021-10-31 — End: 2021-11-02

## 2021-10-31 MED ORDER — LORAZEPAM 2 MG/ML IJ SOLN: 1.0000 mg | INTRAMUSCULAR | Status: DC | PRN

## 2021-10-31 MED ORDER — FOLIC ACID 1 MG PO TABS
1.0000 mg | ORAL_TABLET | Freq: Once | ORAL | Status: AC
Start: 2021-10-31 — End: 2021-10-31
  Administered 2021-10-31: 1 mg via ORAL
  Filled 2021-10-31: qty 1

## 2021-10-31 MED ORDER — LORAZEPAM 2 MG/ML IJ SOLN
0.0000 mg | INTRAMUSCULAR | Status: DC
  Administered 2021-11-01: 1 mg via INTRAVENOUS
  Filled 2021-10-31 (×2): qty 1

## 2021-10-31 NOTE — Progress Notes (Signed)
Pharmacy Brief Note:  Labs obtained @ 1744: Ca 9.7, Albumin 3.8. Discussed with on call provider, repeated labs. Repeat Ca obtained and resulted at 10.1.   Calcium infusion discontinued, pharmacy consulted discontinued.   Cindi Carbon, PharmD 10/31/21 8:33 PM

## 2021-10-31 NOTE — ED Provider Notes (Signed)
[facility]-EMERGENCY DEPT Provider Note   CSN: [id number] Arrival date & time: 10/31/21  7829     History  Chief Complaint  Patient presents with   Loss of Consciousness    Brady Kim is a 56 y.o. male.  HPI All 5 caveat due to amnesia for event 56 year old male history of alcohol abuse presents today with report of syncopal episode.  Patient states he was walking home from the store this morning he began having some shaking as he was going the door. He remembers several people over him. Last night he was drinking alcohol he also had an episode of passing out per his mother.  Patient does not recall this.  He does have pain in his left upper lip secondary to this.  Denies any other pain or injury.    Home Medications Prior to Admission medications   Medication Sig Start Date End Date Taking? Authorizing Provider  amLODipine (NORVASC) 5 MG tablet Take 1 tablet (5 mg total) by mouth daily. 08/21/21   Mancel Bale, MD      Allergies    Patient has no known allergies.    Review of Systems   Review of Systems  Physical Exam Updated Vital Signs BP (!) 134/92   Pulse 75   Temp 98.3 F (36.8 C) (Oral)   Resp 16   Ht 1.803 m (5\' 11" )   Wt 79.4 kg   SpO2 94%   BMI 24.41 kg/m  Physical Exam Vitals and nursing note reviewed.  Constitutional:      Appearance: Normal appearance.  HENT:     Head: Normocephalic.     Right Ear: External ear normal.     Left Ear: External ear normal.     Nose: Nose normal.     Mouth/Throat:     Pharynx: Oropharynx is clear.     Comments: Lip contusion/abrasion Eyes:     Extraocular Movements: Extraocular movements intact.     Pupils: Pupils are equal, round, and reactive to light.  Cardiovascular:     Rate and Rhythm: Normal rate and regular rhythm.     Pulses: Normal pulses.     Heart sounds: Normal heart sounds.  Pulmonary:     Effort: Pulmonary effort is normal.     Breath sounds: Normal breath sounds.   Abdominal:     General: Abdomen is flat. Bowel sounds are normal.     Palpations: Abdomen is soft.  Musculoskeletal:        General: Normal range of motion.     Cervical back: Normal range of motion.     Comments: Cervical, thoracic, or lumbar spine without any signs of trauma or tenderness palpation  Skin:    General: Skin is warm.     Capillary Refill: Capillary refill takes less than 2 seconds.  Neurological:     General: No focal deficit present.     Mental Status: He is alert.  Psychiatric:        Mood and Affect: Mood normal.     ED Results / Procedures / Treatments   Labs (all labs ordered are listed, but only abnormal results are displayed) Labs Reviewed  COMPREHENSIVE METABOLIC PANEL - Abnormal; Notable for the following components:      Result Value   Potassium 2.6 (*)    Chloride 122 (*)    CO2 15 (*)    Glucose, Bld 51 (*)    Creatinine, Ser 0.47 (*)    Calcium 4.6 (*)  Total Protein 3.8 (*)    Albumin 2.1 (*)    AST 12 (*)    Alkaline Phosphatase 36 (*)    All other components within normal limits  MAGNESIUM - Abnormal; Notable for the following components:   Magnesium 1.2 (*)    All other components within normal limits  BLOOD GAS, VENOUS - Abnormal; Notable for the following components:   pCO2, Ven 35 (*)    pO2, Ven 58 (*)    All other components within normal limits  CBC  ETHANOL  RAPID URINE DRUG SCREEN, HOSP PERFORMED  PHOSPHORUS  CALCIUM, IONIZED  PARATHYROID HORMONE, INTACT (NO CA)  CALCIUM  TROPONIN I (HIGH SENSITIVITY)  TROPONIN I (HIGH SENSITIVITY)    EKG EKG Interpretation  Date/Time:  Friday October 31 2021 10:15:29 EDT Ventricular Rate:  92 PR Interval:  128 QRS Duration: 85 QT Interval:  354 QTC Calculation: 438 R Axis:   71 Text Interpretation: Sinus rhythm no acute abnormality noted Confirmed by Pattricia Boss [phone]) on 10/31/2021 12:12:11 PM  Radiology DG Chest Port 1 View  Result Date: 10/31/2021 CLINICAL DATA:  fall,  patient became dizzy and passed out this a.m. walking home from store. EXAM: PORTABLE CHEST 1 VIEW COMPARISON:  November 07, 2019 FINDINGS: The heart size and mediastinal contours are within normal limits. Both lungs are clear. Mild thoracic spondylosis. IMPRESSION: No active disease. Electronically Signed   By: Frazier Richards M.D.   On: 10/31/2021 11:50   CT Cervical Spine Wo Contrast  Result Date: 10/31/2021 CLINICAL DATA:  Neck trauma, dangerous injury mechanism (Age 64-64y) EXAM: CT CERVICAL SPINE WITHOUT CONTRAST TECHNIQUE: Multidetector CT imaging of the cervical spine was performed without intravenous contrast. Multiplanar CT image reconstructions were also generated. RADIATION DOSE REDUCTION: This exam was performed according to the departmental dose-optimization program which includes automated exposure control, adjustment of the mA and/or kV according to patient size and/or use of iterative reconstruction technique. COMPARISON:  None Available. FINDINGS: Alignment: There is loss of the normal lordotic curvature of the cervical spine which may be due to pain, position or spasm. Skull base and vertebrae: No acute fracture. No primary bone lesion or focal pathologic process. Cerumen in the right external ear. Severe cervical spondylosis with very large anterior marginal osteophytes/syndesmophytes. Soft tissues and spinal canal: No prevertebral fluid or swelling. No visible canal hematoma. Disc levels: There is degenerative loss of the disc space seen and is more prominent at C3-C4 anterior lesser extent C6-C7 with prominent marginal osteophytes. Upper chest: Negative. Other: None. IMPRESSION: 1. No fracture or dislocation. Absence of the normal lordotic curvature of the C-spine which may be due to pain, spasm or position. 2. Severe cervical spondylosis with large anterior marginal osteophytes/syndesmophytes and discogenic degenerative changes as above. Electronically Signed   By: Frazier Richards M.D.   On:  10/31/2021 11:41   CT Maxillofacial Wo Contrast  Result Date: 10/31/2021 CLINICAL DATA:  Seizure, fall EXAM: CT MAXILLOFACIAL WITHOUT CONTRAST TECHNIQUE: Multidetector CT imaging of the maxillofacial structures was performed. Multiplanar CT image reconstructions were also generated. RADIATION DOSE REDUCTION: This exam was performed according to the departmental dose-optimization program which includes automated exposure control, adjustment of the mA and/or kV according to patient size and/or use of iterative reconstruction technique. COMPARISON:  CT head 08/21/2021 FINDINGS: Osseous: No acute fracture or mandibular dislocation identified. Stable chronic deformity of the right nasal bone likely representing old fracture. Poor dentition noted with multiple dental caries, and periapical lucencies, largest in the left molars. Orbits:  Negative. No traumatic or inflammatory finding. Sinuses: Chronic changes with no acute air-fluid levels. Soft tissues: No significant soft tissue swelling identified. Limited intracranial: No acute hemorrhage identified. IMPRESSION: 1. No acute fracture identified. 2. Poor dentition noted as described. Electronically Signed   By: Jannifer Hick M.D.   On: 10/31/2021 11:38   CT Head Wo Contrast  Result Date: 10/31/2021 CLINICAL DATA:  Seizure EXAM: CT HEAD WITHOUT CONTRAST TECHNIQUE: Contiguous axial images were obtained from the base of the skull through the vertex without intravenous contrast. RADIATION DOSE REDUCTION: This exam was performed according to the departmental dose-optimization program which includes automated exposure control, adjustment of the mA and/or kV according to patient size and/or use of iterative reconstruction technique. COMPARISON:  CT head 08/21/2021 FINDINGS: Brain: No acute intracranial hemorrhage, mass effect, or herniation. No extra-axial fluid collections. No evidence of acute territorial infarct. No hydrocephalus. Mild cortical volume loss. Mild  patchy hypodensities in the periventricular and subcortical white matter, likely secondary to chronic microvascular ischemic changes. Stable area of encephalomalacia in the right MCA territory consistent with old infarct. Vascular: No hyperdense vessel or unexpected calcification. Skull: Normal. Negative for fracture or focal lesion. Sinuses/Orbits: No acute finding. Other: None. IMPRESSION: No acute intracranial process identified. 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 Electronically Signed   By: Jannifer Hick M.D.   On: 10/31/2021 11:34    Procedures .Critical Care  Performed by: Margarita Grizzle, MD Authorized by: Margarita Grizzle, MD   Critical care provider statement:    Critical care time (minutes):  60   Critical care was necessary to treat or prevent imminent or life-threatening deterioration of the following conditions:  Metabolic crisis   Critical care was time spent personally by me on the following activities:  Development of treatment plan with patient or surrogate, discussions with consultants, evaluation of patient's response to treatment, examination of patient, ordering and review of laboratory studies, ordering and review of radiographic studies, ordering and performing treatments and interventions, pulse oximetry, re-evaluation of patient's condition and review of old charts     Medications Ordered in ED Medications  potassium chloride (KLOR-CON) packet 60 mEq (60 mEq Oral Given 10/31/21 1223)  dextrose 5 %-0.45 % sodium chloride infusion ( Intravenous New Bag/Given 10/31/21 1222)  calcium gluconate 930 mg of elemental calcium in sodium chloride 0.9 % 1,000 mL infusion (has no administration in time range)  magnesium sulfate IVPB 2 g 50 mL (2 g Intravenous New Bag/Given 10/31/21 1329)  folic acid (FOLVITE) tablet 1 mg (has no administration in time range)  thiamine (B-1) injection 100 mg (has no administration in time range)  lactated ringers bolus 1,000  mL (0 mLs Intravenous Stopped 10/31/21 1150)  acetaminophen (TYLENOL) tablet 650 mg (650 mg Oral Given 10/31/21 1051)  calcium gluconate 2 g/ 100 mL sodium chloride IVPB (0 mg Intravenous Stopped 10/31/21 1322)  potassium chloride 10 mEq in 100 mL IVPB (0 mEq Intravenous Stopped 10/31/21 1323)    ED Course/ Medical Decision Making/ A&P Clinical Course as of 10/31/21 1332  Fri Oct 31, 2021  1128 HCT: 43.3 [DR]  1145 CT neck reviewed interpreted no evidence of acute fracture or dislocation and radiologist interpretation concurs [DR]  1146 CT maxillofacial reviewed interpreted no evidence of acute fracture noted and radiologist interpretation concurs [DR]  1147 CT head reviewed interpreted no evidence of acute abnormality noted [DR]    Clinical Course User Index [DR] Margarita Grizzle, MD  Medical Decision Making 56 year old male with episode of altered mental status.  Differential diagnosis includes but is not limited to fall with loss of consciousness, syncope, seizure, intoxication Additionally patient has some pain from similar episode yesterday with pain in his face and abrasion to his lip Patient is being evaluated with CT head EKG Electrolytes CBC Cmet returned and noted to have significant hypokalema and profound hypocalcemia Seizures could be related to hypocalcemia Hypocalcemia work-up will need to be initiated.  Will order ionized calcium and PTH to further evaluate etiologies included  Will begin iv repletion of calcium. Pharmacist consulted Discussed with Dr. Marylyn Ishihara who will see for admission  Plan IV repletion of potassium We will give IV glucose as blood sugar is low.  Amount and/or Complexity of Data Reviewed Labs: ordered. Decision-making details documented in ED Course. Radiology: ordered and independent interpretation performed. Decision-making details documented in ED Course.  Risk OTC drugs. Prescription drug management.           Final  Clinical Impression(s) / ED Diagnoses Final diagnoses:  Syncope and collapse  Hypocalcemia  Hypoglycemia  Hypokalemia    Rx / DC Orders ED Discharge Orders     None         Pattricia Boss, MD 10/31/21 1332

## 2021-10-31 NOTE — H&P (Signed)
History and Physical    Patient: Brady Kim OIN:[id number] DOB: 1965/10/29 DOA: 10/31/2021 DOS: the patient was seen and examined on 10/31/2021 PCP: Patient, No Pcp Per  Patient coming from: Home  Chief Complaint:  Chief Complaint  Patient presents with   Loss of Consciousness   HPI: Brady Kim is a 56 y.o. male with medical history significant of EtOH abuse, HTN. Presenting with syncopal episode. He reports that he was walking home from the store earlier today when he began to feel shaky. The next thing he knew people were over him trying to get him assistance. EMS was called and he was transported to the ED. He is unaware of how long he was down. Per EDP, he may have had another syncope episode last night. The report from family was that he fell off a porch last night and he had appeared to have temporarily passed out. However, the patient is not sure of this. He reports the last time he had alcohol was yesterday. He denies any other aggravating or alleviating factors.    Review of Systems: As mentioned in the history of present illness. All other systems reviewed and are negative. Past Medical History:  Diagnosis Date   Back pain    Degenerative disc disease    Hypertension    Past Surgical History:  Procedure Laterality Date   ANKLE FUSION     KNEE ARTHROSCOPY     Social History:  reports that he has been smoking cigarettes. He has been smoking an average of 1 pack per day. He has never used smokeless tobacco. He reports current alcohol use of about 24.0 standard drinks of alcohol per week. He reports that he does not use drugs.  No Known Allergies  Family History  Problem Relation Age of Onset   Hypertension Mother    Diabetes Maternal Aunt    Cancer Maternal Aunt    Cancer Maternal Grandmother     Prior to Admission medications   Medication Sig Start Date End Date Taking? Authorizing Provider  amLODipine (NORVASC) 5 MG tablet Take 1 tablet (5 mg total) by mouth daily.  08/21/21  Yes Daleen Bo, MD  Aspirin Effervescent (ALKA-SELTZER ORIGINAL PO) Take 1 packet by mouth daily as needed (pain).   Yes [provider]    Physical Exam: Vitals:   10/31/21 1215 10/31/21 1230 10/31/21 1300 10/31/21 1330  BP: 122/83 134/87 (!) 134/92 (!) 137/96  Pulse: 83 85 75 77  Resp: 15 (!) _0 Temp:      TempSrc:      SpO2: 96% 94% 94% 94%  Weight:      Height:       General: 55 y.o. male resting in bed in NAD Eyes: PERRL, normal sclera ENMT: Nares patent w/o discharge, orophaynx clear, dentition poor, ears w/o discharge/lesions/ulcers Neck: Supple, trachea midline Cardiovascular: RRR, +S1, S2, no m/g/r, equal pulses throughout Respiratory: CTABL, no w/r/r, normal WOB GI: BS+, NDNT, no masses noted, no organomegaly noted MSK: No e/c/c Neuro: A&O x 3, no focal deficits Psyc: Appropriate interaction and affect, calm/cooperative  Data Reviewed:  Na+  142 K+  2.6 Cl- 122 CO2  15 Glucose  51 BUN  8 SCR  0.47 Ca2+  4.6 Phos  2.7 Mg2+  1.2 Alk phos  36 Albumin  2.1 AST  12 ALT  20 T bili  0.6 Trp  <2 -> 4 WBC  8.5 Hgb  14.5 Plt  193 EtOH level < 10 UDS negative  CTH:  No acute intracranial process identified. 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  CT C-spine: 1. No fracture or dislocation. Absence of the normal lordotic curvature of the C-spine which may be due to pain, spasm or position. 2. Severe cervical spondylosis with large anterior marginal osteophytes/syndesmophytes and discogenic degenerative changes as above.  CT Maxillofacial: 1. No acute fracture identified. 2. Poor dentition noted as described.  EKG: sinus, no st elevations  Assessment and Plan: Syncopal episode     - admit to inpt, SDU     - story sounds c/w withdrawal seizure     - check EEG     - CTH as above  EtOH abuse EtOH withdrawal     - CIWA protocol: ativan, thiamine, MVI, folate     - fluids: D5-1/2NS     -  counsel against further use     - EtOH level is <10; spoke with neuro, check EEG, can hold on keppra for now  Hypocalcemia Hypokalemia Hypomagnesemia     - replace K+, Mg2+, Ca2+     - repeat labs at 1700hrs to see progress     - check PTH, intact calcium, vit D  Hypoglycemia     - fluids as above     - encourage diet     - dietitian consult     - q2h glucose check x 4 or until glucose is stable  HTN     - continue home regimen  Tobacco abuse     - counsel against further use  Advance Care Planning:   Code Status: FULL  Consults: sidebarred neuro  Family Communication: None at bedside  Severity of Illness: The appropriate patient status for this patient is INPATIENT. Inpatient status is judged to be reasonable and necessary in order to provide the required intensity of service to ensure the patient's safety. The patient's presenting symptoms, physical exam findings, and initial radiographic and laboratory data in the context of their chronic comorbidities is felt to place them at high risk for further clinical deterioration. Furthermore, it is not anticipated that the patient will be medically stable for discharge from the hospital within 2 midnights of admission.   * I certify that at the point of admission it is my clinical judgment that the patient will require inpatient hospital care spanning beyond 2 midnights from the point of admission due to high intensity of service, high risk for further deterioration and high frequency of surveillance required.*  Author: Jonnie Finner, DO 10/31/2021 2:03 PM  For on call review [email or web address].

## 2021-10-31 NOTE — Progress Notes (Addendum)
MEDICATION RELATED CONSULT NOTE - INITIAL   Pharmacy Consult for calcium replenishment  No Known Allergies  Patient Measurements: Height: 5\' 11"  (180.3 cm) Weight: 79.4 kg (175 lb) IBW/kg (Calculated) : 75.3 Adjusted Body Weight:   Vital Signs: Temp: 98.3 F (36.8 C) (07/07 1009) Temp Source: Oral (07/07 1009) BP: 121/85 (07/07 1045) Pulse Rate: 88 (07/07 1045) Intake/Output from previous day: No intake/output data recorded. Intake/Output from this shift: No intake/output data recorded.  Labs: Recent Labs    10/31/21 1055  WBC 8.5  HGB 14.5  HCT 43.3  PLT 193  CREATININE 0.47*  ALBUMIN 2.1*  PROT 3.8*  AST 12*  ALT 20  ALKPHOS 36*  BILITOT 0.6   Estimated Creatinine Clearance: 111.1 mL/min (A) (by C-G formula based on SCr of 0.47 mg/dL (L)).   Medications:  Scheduled:   potassium chloride  60 mEq Oral BID   Infusions:   calcium gluconate      Assessment: 55 yoM presents to ED on 7/7 after syncopal episode, EtOH use yesterday.  Ca noted to be low at 4.6, Corrected for low albumin, CorrCa 6.12. Mag, Phos, iCa, PTH in process  Plan:  Calcium Gluconate 2g IV stat, then continuous infusion at 1.5 mg/kg/hr elemental calcium.  Recheck Ca level at 1700 per MD orders and with AM labs.   Recommend to start oral supplementation when able.   Pharmacy will sign off and defer further dosing to MD.  Please call pharmacy or re-consult if a change in clinical status warrants re-evaluation of dosage.    9/7 PharmD, BCPS Clinical Pharmacist [facility] main pharmacy [phone] 10/31/2021 11:44 AM

## 2021-10-31 NOTE — ED Triage Notes (Signed)
Pt coming via EMS with c/o syncope. Pt reports that he was walking home from the store this morning when he felt dizzy and then passed out. Pt states that they do not think they were unconscious for more than 30 seconds. Pt reports some dizziness still. Pt also reports that they were drinking alcohol last night and had a fall then from missing a step.

## 2021-10-31 NOTE — Progress Notes (Signed)
Unable to go to [facility] for EEGs due to staffing. Will let Dr. Ronaldo Miyamoto know.

## 2021-11-01 DIAGNOSIS — F10939 Alcohol use, unspecified with withdrawal, unspecified: Secondary | ICD-10-CM

## 2021-11-01 DIAGNOSIS — F17200 Nicotine dependence, unspecified, uncomplicated: Secondary | ICD-10-CM

## 2021-11-01 DIAGNOSIS — E876 Hypokalemia: Secondary | ICD-10-CM

## 2021-11-01 DIAGNOSIS — I10 Essential (primary) hypertension: Secondary | ICD-10-CM | POA: Diagnosis not present

## 2021-11-01 DIAGNOSIS — R55 Syncope and collapse: Secondary | ICD-10-CM | POA: Diagnosis not present

## 2021-11-01 DIAGNOSIS — F1020 Alcohol dependence, uncomplicated: Secondary | ICD-10-CM

## 2021-11-01 DIAGNOSIS — E162 Hypoglycemia, unspecified: Secondary | ICD-10-CM

## 2021-11-01 DIAGNOSIS — E871 Hypo-osmolality and hyponatremia: Secondary | ICD-10-CM

## 2021-11-01 LAB — CBC
HCT: 41.3 % (ref 39.0–52.0)
Hemoglobin: 13.9 g/dL (ref 13.0–17.0)
MCH: 31.6 pg (ref 26.0–34.0)
MCHC: 33.7 g/dL (ref 30.0–36.0)
MCV: 93.9 fL (ref 80.0–100.0)
Platelets: 191 10*3/uL (ref 150–400)
RBC: 4.4 MIL/uL (ref 4.22–5.81)
RDW: 13.5 % (ref 11.5–15.5)
WBC: 8.1 10*3/uL (ref 4.0–10.5)
nRBC: 0 % (ref 0.0–0.2)

## 2021-11-01 LAB — COMPREHENSIVE METABOLIC PANEL
ALT: 29 U/L (ref 0–44)
AST: 17 U/L (ref 15–41)
Albumin: 3.5 g/dL (ref 3.5–5.0)
Alkaline Phosphatase: 63 U/L (ref 38–126)
Anion gap: 7 (ref 5–15)
BUN: 7 mg/dL (ref 6–20)
CO2: 21 mmol/L — ABNORMAL LOW (ref 22–32)
Calcium: 8.7 mg/dL — ABNORMAL LOW (ref 8.9–10.3)
Chloride: 104 mmol/L (ref 98–111)
Creatinine, Ser: 0.92 mg/dL (ref 0.61–1.24)
GFR, Estimated: >60 mL/min (ref >60)
Glucose, Bld: 109 mg/dL — ABNORMAL HIGH (ref 70–99)
Potassium: 3.9 mmol/L (ref 3.5–5.1)
Sodium: 132 mmol/L — ABNORMAL LOW (ref 135–145)
Total Bilirubin: 0.7 mg/dL (ref 0.3–1.2)
Total Protein: 6.5 g/dL (ref 6.5–8.1)

## 2021-11-01 LAB — MAGNESIUM: Magnesium: 1.9 mg/dL (ref 1.7–2.4)

## 2021-11-01 LAB — PARATHYROID HORMONE, INTACT (NO CA): PTH: 22 pg/mL (ref 15–65)

## 2021-11-01 LAB — CALCIUM, IONIZED: Calcium, Ionized, Serum: 4.8 mg/dL (ref 4.5–5.6)

## 2021-11-01 LAB — GLUCOSE, CAPILLARY: Glucose-Capillary: 127 mg/dL — ABNORMAL HIGH (ref 70–99)

## 2021-11-01 MED ORDER — NICOTINE 21 MG/24HR TD PT24: 21.0000 mg | MEDICATED_PATCH | Freq: Every day | TRANSDERMAL | Status: DC | PRN

## 2021-11-01 NOTE — Progress Notes (Signed)
PROGRESS NOTE  Brady Kim IWP:[id number] DOB: Dec 17, 1965   PCP: Patient, No Pcp Per  Patient is from: Home.  Lives with family  DOA: 10/31/2021 LOS: 1  Chief complaints Chief Complaint  Patient presents with   Loss of Consciousness     Brief Narrative / Interim history: 56 year old M with PMH of EtOH and tobacco use disorder, HTN and seizure not on medication brought to ED after what looks like a syncopal episode, and admitted for syncopal episode, EtOH withdrawal, hypoglycemia and electrolyte derangement.   Subjective: Seen and examined earlier this morning.  No major events overnight of this morning.  No complaints.  No withdrawal symptoms.  Reports drinking about 4 of the 24 ounce beers but not every day.  Last drink was a day prior to presentation.  He reports remote history of seizure.  He is not on seizure medication.  Also reports smoking about a pack.  He denies chest pain, dizziness, palpitation, GI or UTI symptoms.  Denies audiovisual hallucination.  Objective: Vitals:   11/01/21 0600 11/01/21 0700 11/01/21 0800 11/01/21 0900  BP: 129/86 126/84 128/74 134/89  Pulse: 84 83 78 86  Resp: 18 18 17 20   Temp:   99.1 F (37.3 C)   TempSrc:   Oral   SpO2: (!) 89% 90% 91% 92%  Weight:      Height:        Examination:  GENERAL: No apparent distress.  Nontoxic. HEENT: MMM.  Vision and hearing grossly intact.  NECK: Supple.  No apparent JVD.  RESP:  No IWOB.  Fair aeration bilaterally. CVS:  RRR. Heart sounds normal.  ABD/GI/GU: BS+. Abd soft, NTND.  MSK/EXT:  Moves extremities. No apparent deformity. No edema.  SKIN: no apparent skin lesion or wound NEURO: Awake, alert and oriented appropriately.  No apparent focal neuro deficit. PSYCH: Calm. Normal affect.   Procedures:  None  Microbiology summarized: None  Assessment and plan: Principal Problem:   Syncopal episodes Active Problems:   Tobacco use disorder   Alcohol use disorder, severe, dependence (HCC)    Alcohol withdrawal (HCC)   Hypocalcemia   Hypokalemia   Hypomagnesemia   Hypoglycemia   HTN (hypertension)   Hyponatremia  Syncopal episode: Likely due to dehydration, hypoglycemia and electrolyte derangement in the setting of alcohol abuse.  Cannot exclude alcohol withdrawal seizure although he does not seem to have significant withdrawal symptoms now.  No significant finding on EKG or telemetry.  Serial troponin negative.  UDS negative.  No focal neurodeficit.  CT head, C-spine and maxillofacial without acute finding.  -Continue telemetry monitoring -Electrolytes corrected. -Follow EEG  -Continue IV fluid -OOB/ambulation  Alcohol use disorder with moderate dependence-reports drinking about 4 of 24 ounce beers.  Electrolyte derangement-hyponatremia/hypokalemia/hypocalcemia/hypomagnesemia-resolved except mild hyponatremia Non-anion gap metabolic acidosis-resolved Hypoglycemia-resolved -Electrolytes corrected -Continue CIWA with as needed Ativan -Continue multivitamin, thiamine and folic acid -TOC consulted for further counseling and resources.  History of seizure-remote.  Likely alcohol-related.  Not on AED. -Follow EEG  Tobacco use disorder -Encourage smoking cessation -Nicotine patch as needed  Essential hypertension: Normotensive. -Continue home amlodipine  Body mass index is 21.95 kg/m.           DVT prophylaxis:  enoxaparin (LOVENOX) injection 40 mg Start: 10/31/21 1800  Code Status: Full code Family Communication: None at bedside Level of care: Telemetry Status is: Inpatient Remains inpatient appropriate because: Alcohol withdrawal   Final disposition: Home Consultants:  None  Sch Meds:  Scheduled Meds:  amLODipine  5 mg Oral  Daily   Chlorhexidine Gluconate Cloth  6 each Topical Daily   enoxaparin (LOVENOX) injection  40 mg Subcutaneous Q24H   LORazepam  0-4 mg Intravenous Q4H   Followed by   Melene Muller ON 11/02/2021] LORazepam  0-4 mg Intravenous Q8H    multivitamin with minerals  1 tablet Oral Daily   thiamine  100 mg Oral Daily   Continuous Infusions:  dextrose 5 % and 0.45% NaCl 75 mL/hr at 11/01/21 0944   PRN Meds:.ibuprofen, LORazepam **OR** LORazepam, nicotine, ondansetron **OR** ondansetron (ZOFRAN) IV, mouth rinse, oxyCODONE  Antimicrobials: Anti-infectives (From admission, onward)    None        I have personally reviewed the following labs and images: CBC: Recent Labs  Lab 10/31/21 1055 11/01/21 0254  WBC 8.5 8.1  HGB 14.5 13.9  HCT 43.3 41.3  MCV 95.4 93.9  PLT 193 191   BMP &GFR Recent Labs  Lab 10/31/21 1055 10/31/21 1142 10/31/21 1223 10/31/21 1744 10/31/21 1928 11/01/21 0254  NA 142  --   --  136 137 132*  K 2.6*  --   --  4.0 4.2 3.9  CL 122*  --   --  104 105 104  CO2 15*  --   --  22 24 21*  GLUCOSE 51*  --   --  129* 108* 109*  BUN 8  --   --  10 12 7   CREATININE 0.47*  --   --  0.80 0.87 0.92  CALCIUM 4.6*  --   --  9.7 10.1 8.7*  MG  --  1.2*  --  2.2  --  1.9  PHOS  --   --  2.7 2.9  --   --    Estimated Creatinine Clearance: 91.6 mL/min (by C-G formula based on SCr of 0.92 mg/dL). Liver & Pancreas: Recent Labs  Lab 10/31/21 1055 10/31/21 1744 11/01/21 0254  AST 12*  --  17  ALT 20  --  29  ALKPHOS 36*  --  63  BILITOT 0.6  --  0.7  PROT 3.8*  --  6.5  ALBUMIN 2.1* 3.8 3.5   No results for input(s): "LIPASE", "AMYLASE" in the last 168 hours. No results for input(s): "AMMONIA" in the last 168 hours. Diabetic: No results for input(s): "HGBA1C" in the last 72 hours. Recent Labs  Lab 10/31/21 1550 10/31/21 1802 10/31/21 1940 10/31/21 2138 11/01/21 0724  GLUCAP 104* 121* 107* 101* 127*   Cardiac Enzymes: No results for input(s): "CKTOTAL", "CKMB", "CKMBINDEX", "TROPONINI" in the last 168 hours. No results for input(s): "PROBNP" in the last 8760 hours. Coagulation Profile: No results for input(s): "INR", "PROTIME" in the last 168 hours. Thyroid Function Tests: No  results for input(s): "TSH", "T4TOTAL", "FREET4", "T3FREE", "THYROIDAB" in the last 72 hours. Lipid Profile: No results for input(s): "CHOL", "HDL", "LDLCALC", "TRIG", "CHOLHDL", "LDLDIRECT" in the last 72 hours. Anemia Panel: No results for input(s): "VITAMINB12", "FOLATE", "FERRITIN", "TIBC", "IRON", "RETICCTPCT" in the last 72 hours. Urine analysis:    Component Value Date/Time   COLORURINE YELLOW 09/17/2016 1654   APPEARANCEUR CLEAR 09/17/2016 1654   LABSPEC 1.023 09/17/2016 1654   PHURINE 6.0 09/17/2016 1654   GLUCOSEU NEGATIVE 09/17/2016 1654   HGBUR NEGATIVE 09/17/2016 1654   BILIRUBINUR NEGATIVE 09/17/2016 1654   KETONESUR 20 (A) 09/17/2016 1654   PROTEINUR 30 (A) 09/17/2016 1654   UROBILINOGEN 1.0 12/21/2010 0930   NITRITE NEGATIVE 09/17/2016 1654   LEUKOCYTESUR NEGATIVE 09/17/2016 1654   Sepsis Labs: Invalid input(s): "  PROCALCITONIN", "LACTICIDVEN"  Microbiology: Recent Results (from the past 240 hour(s))  MRSA Next Gen by PCR, Nasal     Status: None   Collection Time: 10/31/21  4:46 PM   Specimen: Nasal Mucosa; Nasal Swab  Result Value Ref Range Status   MRSA by PCR Next Gen NOT DETECTED NOT DETECTED Final    Comment: (NOTE) The GeneXpert MRSA Assay (FDA approved for NASAL specimens only), is one component of a comprehensive MRSA colonization surveillance program. It is not intended to diagnose MRSA infection nor to guide or monitor treatment for MRSA infections. Test performance is not FDA approved in patients less than 44 years old. Performed at [facility], [address] [place], [place] [address]     Radiology Studies: [facility] Chest Port 1 View  Result Date: 10/31/2021 CLINICAL DATA:  fall, patient became dizzy and passed out this a.m. walking home from store. EXAM: PORTABLE CHEST 1 VIEW COMPARISON:  November 07, 2019 FINDINGS: The heart size and mediastinal contours are within normal limits. Both lungs are clear. Mild thoracic spondylosis.  IMPRESSION: No active disease. Electronically Signed   By: Marjo Bicker M.D.   On: 10/31/2021 11:50   CT Cervical Spine Wo Contrast  Result Date: 10/31/2021 CLINICAL DATA:  Neck trauma, dangerous injury mechanism (Age 17-64y) EXAM: CT CERVICAL SPINE WITHOUT CONTRAST TECHNIQUE: Multidetector CT imaging of the cervical spine was performed without intravenous contrast. Multiplanar CT image reconstructions were also generated. RADIATION DOSE REDUCTION: This exam was performed according to the departmental dose-optimization program which includes automated exposure control, adjustment of the mA and/or kV according to patient size and/or use of iterative reconstruction technique. COMPARISON:  None Available. FINDINGS: Alignment: There is loss of the normal lordotic curvature of the cervical spine which may be due to pain, position or spasm. Skull base and vertebrae: No acute fracture. No primary bone lesion or focal pathologic process. Cerumen in the right external ear. Severe cervical spondylosis with very large anterior marginal osteophytes/syndesmophytes. Soft tissues and spinal canal: No prevertebral fluid or swelling. No visible canal hematoma. Disc levels: There is degenerative loss of the disc space seen and is more prominent at C3-C4 anterior lesser extent C6-C7 with prominent marginal osteophytes. Upper chest: Negative. Other: None. IMPRESSION: 1. No fracture or dislocation. Absence of the normal lordotic curvature of the C-spine which may be due to pain, spasm or position. 2. Severe cervical spondylosis with large anterior marginal osteophytes/syndesmophytes and discogenic degenerative changes as above. Electronically Signed   By: Marjo Bicker M.D.   On: 10/31/2021 11:41   CT Maxillofacial Wo Contrast  Result Date: 10/31/2021 CLINICAL DATA:  Seizure, fall EXAM: CT MAXILLOFACIAL WITHOUT CONTRAST TECHNIQUE: Multidetector CT imaging of the maxillofacial structures was performed. Multiplanar CT image  reconstructions were also generated. RADIATION DOSE REDUCTION: This exam was performed according to the departmental dose-optimization program which includes automated exposure control, adjustment of the mA and/or kV according to patient size and/or use of iterative reconstruction technique. COMPARISON:  CT head 08/21/2021 FINDINGS: Osseous: No acute fracture or mandibular dislocation identified. Stable chronic deformity of the right nasal bone likely representing old fracture. Poor dentition noted with multiple dental caries, and periapical lucencies, largest in the left molars. Orbits: Negative. No traumatic or inflammatory finding. Sinuses: Chronic changes with no acute air-fluid levels. Soft tissues: No significant soft tissue swelling identified. Limited intracranial: No acute hemorrhage identified. IMPRESSION: 1. No acute fracture identified. 2. Poor dentition noted as described. Electronically Signed   By: Jannifer Hick  M.D.   On: 10/31/2021 11:38   CT Head Wo Contrast  Result Date: 10/31/2021 CLINICAL DATA:  Seizure EXAM: CT HEAD WITHOUT CONTRAST TECHNIQUE: Contiguous axial images were obtained from the base of the skull through the vertex without intravenous contrast. RADIATION DOSE REDUCTION: This exam was performed according to the departmental dose-optimization program which includes automated exposure control, adjustment of the mA and/or kV according to patient size and/or use of iterative reconstruction technique. COMPARISON:  CT head 08/21/2021 FINDINGS: Brain: No acute intracranial hemorrhage, mass effect, or herniation. No extra-axial fluid collections. No evidence of acute territorial infarct. No hydrocephalus. Mild cortical volume loss. Mild patchy hypodensities in the periventricular and subcortical white matter, likely secondary to chronic microvascular ischemic changes. Stable area of encephalomalacia in the right MCA territory consistent with old infarct. Vascular: No hyperdense vessel  or unexpected calcification. Skull: Normal. Negative for fracture or focal lesion. Sinuses/Orbits: No acute finding. Other: None. IMPRESSION: No acute intracranial process identified. 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 Electronically Signed   By: Jannifer Hick M.D.   On: 10/31/2021 11:34      Shonna Deiter T. Johnnae Impastato [facility]  If 7PM-7AM, please contact night-coverage [email or web address] 11/01/2021, 10:33 AM

## 2021-11-01 NOTE — Plan of Care (Signed)
  Problem: Education: Goal: Knowledge of General Education information will improve Description Including pain rating scale, medication(s)/side effects and non-pharmacologic comfort measures Outcome: Progressing   Problem: Health Behavior/Discharge Planning: Goal: Ability to manage health-related needs will improve Outcome: Progressing   

## 2021-11-02 DIAGNOSIS — F10939 Alcohol use, unspecified with withdrawal, unspecified: Secondary | ICD-10-CM | POA: Diagnosis not present

## 2021-11-02 DIAGNOSIS — F1020 Alcohol dependence, uncomplicated: Secondary | ICD-10-CM | POA: Diagnosis not present

## 2021-11-02 DIAGNOSIS — R55 Syncope and collapse: Secondary | ICD-10-CM | POA: Diagnosis not present

## 2021-11-02 LAB — CK: Total CK: 186 U/L (ref 49–397)

## 2021-11-02 LAB — RENAL FUNCTION PANEL
Albumin: 3.6 g/dL (ref 3.5–5.0)
Anion gap: 6 (ref 5–15)
BUN: 6 mg/dL (ref 6–20)
CO2: 29 mmol/L (ref 22–32)
Calcium: 8.7 mg/dL — ABNORMAL LOW (ref 8.9–10.3)
Chloride: 104 mmol/L (ref 98–111)
Creatinine, Ser: 0.85 mg/dL (ref 0.61–1.24)
GFR, Estimated: >60 mL/min (ref >60)
Glucose, Bld: 110 mg/dL — ABNORMAL HIGH (ref 70–99)
Phosphorus: 3.4 mg/dL (ref 2.5–4.6)
Potassium: 3.8 mmol/L (ref 3.5–5.1)
Sodium: 139 mmol/L (ref 135–145)

## 2021-11-02 LAB — MAGNESIUM: Magnesium: 2 mg/dL (ref 1.7–2.4)

## 2021-11-02 LAB — CALCIUM, IONIZED: Calcium, Ionized, Serum: 5.5 mg/dL (ref 4.5–5.6)

## 2021-11-02 LAB — VITAMIN D 25 HYDROXY (VIT D DEFICIENCY, FRACTURES): Vit D, 25-Hydroxy: 18.65 ng/mL — ABNORMAL LOW (ref 30–100)

## 2021-11-02 MED ORDER — NICOTINE 21 MG/24HR TD PT24: 21.0000 mg | MEDICATED_PATCH | Freq: Every day | TRANSDERMAL | 0 refills | Status: AC | PRN

## 2021-11-02 MED ORDER — THIAMINE HCL 100 MG PO TABS: 100.0000 mg | ORAL_TABLET | Freq: Every day | ORAL | 1 refills | Status: AC

## 2021-11-02 MED ORDER — VITAMIN D 50 MCG (2000 UT) PO CAPS: 2000.0000 [IU] | ORAL_CAPSULE | Freq: Every day | ORAL | Status: AC

## 2021-11-02 MED ORDER — ADULT MULTIVITAMIN W/MINERALS CH: 1.0000 | ORAL_TABLET | Freq: Every day | ORAL | Status: AC

## 2021-11-02 MED ORDER — VITAMIN D 25 MCG (1000 UNIT) PO TABS
1000.0000 [IU] | ORAL_TABLET | Freq: Every day | ORAL | Status: DC
  Administered 2021-11-02: 1000 [IU] via ORAL
  Filled 2021-11-02: qty 1

## 2021-11-02 NOTE — Discharge Summary (Signed)
Physician Discharge Summary  Brady Kim ZOX:[id number] DOB: 01/11/1966 DOA: 10/31/2021  PCP: Patient, No Pcp Per  Admit date: 10/31/2021 Discharge date: 11/02/2021 Admitted From: Home Disposition: Home Recommendations for Outpatient Follow-up:  Follow ups as below. Please obtain BMP and CBC Continue encouraging alcohol and smoking cessation Please follow up on the following pending results: None  Home Health: Not indicated Equipment/Devices: Not indicated  Discharge Condition: Stable CODE STATUS: Full code  Follow-up Information     [facility] Follow up.   Why: Call to get established with a PCP and for hospital follow up. Contact information: [address] [place] [address] [phone]        [facility] Follow up.   Why: Call to get established with a PCP and for hospital follow up. Contact information: [address] [place] [address] [phone]                Hospital course 56 year old M with PMH of EtOH and tobacco use disorder, HTN and seizure not on medication brought to ED after what looks like a syncopal episode, and admitted for syncopal episode, EtOH withdrawal, hypoglycemia and electrolyte derangement.   Patient remained stable.  No further syncope.  No withdrawal symptoms either.  Electrolytes corrected.  He is discharged home to follow-up with his primary care doctor.  Counseled on alcohol and smoking cessation.  Provided with resources for PCP and alcohol use disorder.  See individual problem list below for more.   Problems addressed during this hospitalization Principal Problem:   Syncopal episodes Active Problems:   Tobacco use disorder   Alcohol use disorder, severe, dependence (HCC)   Alcohol withdrawal (HCC)   Hypocalcemia   Hypokalemia   Hypomagnesemia   Hypoglycemia   HTN (hypertension)   Hyponatremia   Syncopal  episode: Likely due to dehydration, hypoglycemia and electrolyte derangement in the setting of alcohol abuse.  Has remote history of seizure but not on AED.  No significant finding on EKG or telemetry. Serial troponin negative.  UDS negative but cannot exclude synthetic opiate.  No focal neurodeficit.  CT head, C-spine and maxillofacial without acute finding.  Neurology did not feel AEDs needed.  Patient has not had further syncope.  Hypoglycemia, electrolyte abnormalities and dehydration resolved. -Counseled on alcohol cessation and provided with resources.   Alcohol use disorder with moderate dependence-reports drinking about 4 of 24 ounce beers.  No withdrawal Electrolyte derangement-hyponatremia/hypokalemia/hypocalcemia/hypomagnesemia-resolved  Non-anion gap metabolic acidosis-resolved Hypoglycemia-resolved -Continue multivitamin and thiamine    History of seizure-remote.  Likely alcohol-related.  Not on AED.   Tobacco use disorder -Encouraged smoking cessation   Essential hypertension: Normotensive. -Continue home amlodipine  Vitamin D insufficiency -P.o. vitamin D 2000 units daily           Vital signs Vitals:   11/01/21 2123 11/02/21 0235 11/02/21 0554 11/02/21 0844  BP: (!) 131/94 118/80 117/83 122/88  Pulse: 68 62 60 78  Temp: 98 F (36.7 C) 98 F (36.7 C) 97.9 F (36.6 C)   Resp:    16  Height:      Weight:      SpO2: 97% 98% 98% 99%  TempSrc: Oral Oral Oral   BMI (Calculated):         Discharge exam  GENERAL: No apparent distress.  Nontoxic. HEENT: MMM.  Vision and hearing grossly intact.  NECK: Supple.  No apparent JVD.  RESP:  No IWOB.  Fair  aeration bilaterally. CVS:  RRR. Heart sounds normal.  ABD/GI/GU: BS+. Abd soft, NTND.  MSK/EXT:  Moves extremities. No apparent deformity. No edema.  SKIN: no apparent skin lesion or wound NEURO: Awake and alert. Oriented appropriately.  No apparent focal neuro deficit. PSYCH: Calm. Normal affect.   Discharge  Instructions Discharge Instructions     Diet - low sodium heart healthy   Complete by: As directed    Discharge instructions   Complete by: As directed    It has been a pleasure taking care of you!  You were hospitalized due to syncope likely from dehydration and electrolyte abnormalities in the setting of excess alcohol use.  We strongly recommend you stops drinking alcohol or drink in moderation, less than 2 of the 12 ounce beers or its equivalent.  It is important that you quit smoking cigarettes.  You may use nicotine patch to help you quit smoking.  Nicotine patch is available over-the-counter.  You may also discuss other options to help you quit smoking with your primary care doctor. You can also talk to professional counselors at 1-800-QUIT-NOW [phone]) for free smoking cessation counseling.  Please review your new medication list and the directions on your medications before you take them.  Follow-up with your primary care doctor in 1 to 2 weeks or sooner if needed.     Take care,   Increase activity slowly   Complete by: As directed       Allergies as of 11/02/2021   No Known Allergies      Medication List     STOP taking these medications    ALKA-SELTZER ORIGINAL PO       TAKE these medications    amLODipine 5 MG tablet Commonly known as: NORVASC Take 1 tablet (5 mg total) by mouth daily.   multivitamin with minerals Tabs tablet Take 1 tablet by mouth daily.   nicotine 21 mg/24hr patch Commonly known as: NICODERM CQ - dosed in mg/24 hours Place 1 patch (21 mg total) onto the skin daily as needed (Craving cigarettes).   thiamine 100 MG tablet Take 1 tablet (100 mg total) by mouth daily.   Vitamin D 50 MCG (2000 UT) Caps Take 1 capsule (2,000 Units total) by mouth daily.        Consultations: None  Procedures/Studies:   DG Chest Port 1 View  Result Date: 10/31/2021 CLINICAL DATA:  fall, patient became dizzy and passed out this a.m.  walking home from store. EXAM: PORTABLE CHEST 1 VIEW COMPARISON:  November 07, 2019 FINDINGS: The heart size and mediastinal contours are within normal limits. Both lungs are clear. Mild thoracic spondylosis. IMPRESSION: No active disease. Electronically Signed   By: Marjo Bicker M.D.   On: 10/31/2021 11:50   CT Cervical Spine Wo Contrast  Result Date: 10/31/2021 CLINICAL DATA:  Neck trauma, dangerous injury mechanism (Age 19-64y) EXAM: CT CERVICAL SPINE WITHOUT CONTRAST TECHNIQUE: Multidetector CT imaging of the cervical spine was performed without intravenous contrast. Multiplanar CT image reconstructions were also generated. RADIATION DOSE REDUCTION: This exam was performed according to the departmental dose-optimization program which includes automated exposure control, adjustment of the mA and/or kV according to patient size and/or use of iterative reconstruction technique. COMPARISON:  None Available. FINDINGS: Alignment: There is loss of the normal lordotic curvature of the cervical spine which may be due to pain, position or spasm. Skull base and vertebrae: No acute fracture. No primary bone lesion or focal pathologic process. Cerumen in the right external ear.  Severe cervical spondylosis with very large anterior marginal osteophytes/syndesmophytes. Soft tissues and spinal canal: No prevertebral fluid or swelling. No visible canal hematoma. Disc levels: There is degenerative loss of the disc space seen and is more prominent at C3-C4 anterior lesser extent C6-C7 with prominent marginal osteophytes. Upper chest: Negative. Other: None. IMPRESSION: 1. No fracture or dislocation. Absence of the normal lordotic curvature of the C-spine which may be due to pain, spasm or position. 2. Severe cervical spondylosis with large anterior marginal osteophytes/syndesmophytes and discogenic degenerative changes as above. Electronically Signed   By: Marjo Bicker M.D.   On: 10/31/2021 11:41   CT Maxillofacial Wo  Contrast  Result Date: 10/31/2021 CLINICAL DATA:  Seizure, fall EXAM: CT MAXILLOFACIAL WITHOUT CONTRAST TECHNIQUE: Multidetector CT imaging of the maxillofacial structures was performed. Multiplanar CT image reconstructions were also generated. RADIATION DOSE REDUCTION: This exam was performed according to the departmental dose-optimization program which includes automated exposure control, adjustment of the mA and/or kV according to patient size and/or use of iterative reconstruction technique. COMPARISON:  CT head 08/21/2021 FINDINGS: Osseous: No acute fracture or mandibular dislocation identified. Stable chronic deformity of the right nasal bone likely representing old fracture. Poor dentition noted with multiple dental caries, and periapical lucencies, largest in the left molars. Orbits: Negative. No traumatic or inflammatory finding. Sinuses: Chronic changes with no acute air-fluid levels. Soft tissues: No significant soft tissue swelling identified. Limited intracranial: No acute hemorrhage identified. IMPRESSION: 1. No acute fracture identified. 2. Poor dentition noted as described. Electronically Signed   By: Jannifer Hick M.D.   On: 10/31/2021 11:38   CT Head Wo Contrast  Result Date: 10/31/2021 CLINICAL DATA:  Seizure EXAM: CT HEAD WITHOUT CONTRAST TECHNIQUE: Contiguous axial images were obtained from the base of the skull through the vertex without intravenous contrast. RADIATION DOSE REDUCTION: This exam was performed according to the departmental dose-optimization program which includes automated exposure control, adjustment of the mA and/or kV according to patient size and/or use of iterative reconstruction technique. COMPARISON:  CT head 08/21/2021 FINDINGS: Brain: No acute intracranial hemorrhage, mass effect, or herniation. No extra-axial fluid collections. No evidence of acute territorial infarct. No hydrocephalus. Mild cortical volume loss. Mild patchy hypodensities in the periventricular  and subcortical white matter, likely secondary to chronic microvascular ischemic changes. Stable area of encephalomalacia in the right MCA territory consistent with old infarct. Vascular: No hyperdense vessel or unexpected calcification. Skull: Normal. Negative for fracture or focal lesion. Sinuses/Orbits: No acute finding. Other: None. IMPRESSION: No acute intracranial process identified. 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 Electronically Signed   By: Jannifer Hick M.D.   On: 10/31/2021 11:34       The results of significant diagnostics from this hospitalization (including imaging, microbiology, ancillary and laboratory) are listed below for reference.     Microbiology: Recent Results (from the past 240 hour(s))  MRSA Next Gen by PCR, Nasal     Status: None   Collection Time: 10/31/21  4:46 PM   Specimen: Nasal Mucosa; Nasal Swab  Result Value Ref Range Status   MRSA by PCR Next Gen NOT DETECTED NOT DETECTED Final    Comment: (NOTE) The GeneXpert MRSA Assay (FDA approved for NASAL specimens only), is one component of a comprehensive MRSA colonization surveillance program. It is not intended to diagnose MRSA infection nor to guide or monitor treatment for MRSA infections. Test performance is not FDA approved in patients less than 68 years old. Performed at [facility], [address], [place], [place] [address]      Labs:  CBC: Recent Labs  Lab 10/31/21 1055 11/01/21 0254  WBC 8.5 8.1  HGB 14.5 13.9  HCT 43.3 41.3  MCV 95.4 93.9  PLT 193 191   BMP &GFR Recent Labs  Lab 10/31/21 1055 10/31/21 1142 10/31/21 1223 10/31/21 1744 10/31/21 1928 11/01/21 0254 11/02/21 0542  NA 142  --   --  136 137 132* 139  K 2.6*  --   --  4.0 4.2 3.9 3.8  CL 122*  --   --  104 105 104 104  CO2 15*  --   --  22 24 21* 29  GLUCOSE 51*  --   --  129* 108* 109* 110*  BUN 8  --   --  10 12 7 6   CREATININE 0.47*  --   --  0.80 0.87 0.92  0.85  CALCIUM 4.6*  --   --  9.7 10.1 8.7* 8.7*  MG  --  1.2*  --  2.2  --  1.9 2.0  PHOS  --   --  2.7 2.9  --   --  3.4   Estimated Creatinine Clearance: 99.2 mL/min (by C-G formula based on SCr of 0.85 mg/dL). Liver & Pancreas: Recent Labs  Lab 10/31/21 1055 10/31/21 1744 11/01/21 0254 11/02/21 0542  AST 12*  --  17  --   ALT 20  --  29  --   ALKPHOS 36*  --  63  --   BILITOT 0.6  --  0.7  --   PROT 3.8*  --  6.5  --   ALBUMIN 2.1* 3.8 3.5 3.6   No results for input(s): "LIPASE", "AMYLASE" in the last 168 hours. No results for input(s): "AMMONIA" in the last 168 hours. Diabetic: No results for input(s): "HGBA1C" in the last 72 hours. Recent Labs  Lab 10/31/21 1550 10/31/21 1802 10/31/21 1940 10/31/21 2138 11/01/21 0724  GLUCAP 104* 121* 107* 101* 127*   Cardiac Enzymes: Recent Labs  Lab 11/02/21 0542  CKTOTAL 186   No results for input(s): "PROBNP" in the last 8760 hours. Coagulation Profile: No results for input(s): "INR", "PROTIME" in the last 168 hours. Thyroid Function Tests: No results for input(s): "TSH", "T4TOTAL", "FREET4", "T3FREE", "THYROIDAB" in the last 72 hours. Lipid Profile: No results for input(s): "CHOL", "HDL", "LDLCALC", "TRIG", "CHOLHDL", "LDLDIRECT" in the last 72 hours. Anemia Panel: No results for input(s): "VITAMINB12", "FOLATE", "FERRITIN", "TIBC", "IRON", "RETICCTPCT" in the last 72 hours. Urine analysis:    Component Value Date/Time   COLORURINE YELLOW 09/17/2016 1654   APPEARANCEUR CLEAR 09/17/2016 1654   LABSPEC 1.023 09/17/2016 1654   PHURINE 6.0 09/17/2016 1654   GLUCOSEU NEGATIVE 09/17/2016 1654   HGBUR NEGATIVE 09/17/2016 1654   BILIRUBINUR NEGATIVE 09/17/2016 1654   KETONESUR 20 (A) 09/17/2016 1654   PROTEINUR 30 (A) 09/17/2016 1654   UROBILINOGEN 1.0 12/21/2010 0930   NITRITE NEGATIVE 09/17/2016 1654   LEUKOCYTESUR NEGATIVE 09/17/2016 1654   Sepsis Labs: Invalid input(s): "PROCALCITONIN",  "LACTICIDVEN"   SIGNED:  09/19/2016, MD  [facility] 11/02/2021, 11:26 AM

## 2021-11-02 NOTE — Discharge Instructions (Signed)
Outpatient Substance Use Treatment Services   [facility] [address] [place], [place] [address]  [phone] Private insurance, Medicare A&B, and [facility]   [facility])  [address],  [place], [place] [address] [phone] Medicaid, Self Pay   [facility]      [address]  [place], [place] [phone] Medicaid and [facility], Self Pay   [facility] [address]  [place], [place]  [phone] [facility], and Self Pay  [facility]      [address]    [place], [place] [address]  [phone] or [phone] Private Insurance Only   [facility] [address]  [place], [place] [address] [phone] Medicaid, Medicare, Private Insurance  [facility] at the [facility] [address]  [place], [place] [address] [phone] Services are free or reduced  [facility]  [address] [phone]  Self Pay only, sliding scale  [facility]  [address]  [place], [place] [address] [phone] ([facility]) Self Pay, Medicaid Only   [facility] [address], [place] [address] [phone] Medicaid, Medicare, Private Insurance  [facility] Locations  [facility]  [address]  [place], [place]  [phone] [facility] Program for individuals experiencing homelessness Self Pay, No insurance  [facility] program: [facility] [address] [place], [place] [address] [phone]  [facility] Women[facility] program: [facility] [address] [place], [place] [address] [phone] [facility] Based Program for individuals experiencing homelessness Self Pay, No insurance  [facility]  Div[facility] [address] [place], [place] [address]  [phone] [facility] Program for individuals experiencing homelessness Self Pay, No insurance  [facility] [address] [place], [place] [address] [phone] [facility] Program for individuals experiencing homelessness Self Pay, No insurance  [facility] [address] [place], [place] [phone] Christian Based Program for males experiencing homelessness Self Pay, No insurance

## 2021-11-02 NOTE — Progress Notes (Signed)
Message placed to the SW regarding patients need for PCP. SW stated patient would be referred to [place] wellness. Contact information added for [place].

## 2021-11-02 NOTE — TOC Transition Note (Addendum)
Transition of Care [facility]) - CM/SW Discharge Note   Patient Details  Name: Brady Kim MRN: [id number] Date of Birth: May 08, 1965  Transition of Care [facility]) CM/SW Contact:  Darleene Cleaver, LCSW Phone Number: 11/02/2021, 10:30 AM   Clinical Narrative:    CSW was informed that patient does not have a PCP.  CSW provided contact information for [facility] and [facility].  Patient will have to call on Monday to get an appointment scheduled.  Substance abuse resources also included in AVS.  CSW to sign off, please reconsult if other social work needs arise.   Final next level of care: Home/Self Care Barriers to Discharge: Barriers Resolved   Patient Goals and CMS Choice Patient states their goals for this hospitalization and ongoing recovery are:: To return back home.      Discharge Placement                       Discharge Plan and Services                                     Social Determinants of Health (SDOH) Interventions     Readmission Risk Interventions     No data to display

## 2021-11-02 NOTE — Progress Notes (Addendum)
Discharge instructions were reviewed. The patient was encouraged to utilize tobacco cessation recommendations and alcohol consumption in moderation. Pt also encouraged to follow up at the [facility] center to morrow to schedule an appointment for 1-2 weeks post discharge. The pt was stable,alert and oriented x4, ambulatory without changes to am assessment.

## 2021-12-02 ENCOUNTER — Encounter: Payer: Self-pay | Admitting: Family Medicine

## 2021-12-02 ENCOUNTER — Ambulatory Visit: Payer: Medicaid Other | Attending: Family Medicine | Admitting: Family Medicine

## 2021-12-02 VITALS — BP 112/77 | HR 79 | Temp 98.0°F | Ht 71.0 in | Wt 161.2 lb

## 2021-12-02 DIAGNOSIS — I10 Essential (primary) hypertension: Secondary | ICD-10-CM

## 2021-12-02 DIAGNOSIS — Z8673 Personal history of transient ischemic attack (TIA), and cerebral infarction without residual deficits: Secondary | ICD-10-CM | POA: Diagnosis not present

## 2021-12-02 DIAGNOSIS — M5136 Other intervertebral disc degeneration, lumbar region: Secondary | ICD-10-CM

## 2021-12-02 DIAGNOSIS — F1020 Alcohol dependence, uncomplicated: Secondary | ICD-10-CM

## 2021-12-02 DIAGNOSIS — Z131 Encounter for screening for diabetes mellitus: Secondary | ICD-10-CM

## 2021-12-02 LAB — POCT GLYCOSYLATED HEMOGLOBIN (HGB A1C): Hemoglobin A1C: 5.8 % — AB (ref 4.0–5.6)

## 2021-12-02 LAB — GLUCOSE, POCT (MANUAL RESULT ENTRY): POC Glucose: 104 mg/dl — AB (ref 70–99)

## 2021-12-02 MED ORDER — METHOCARBAMOL 750 MG PO TABS: 750.0000 mg | ORAL_TABLET | Freq: Three times a day (TID) | ORAL | 1 refills | Status: DC | PRN

## 2021-12-02 MED ORDER — AMLODIPINE BESYLATE 5 MG PO TABS: 5.0000 mg | ORAL_TABLET | Freq: Every day | ORAL | 6 refills | Status: DC

## 2021-12-02 MED ORDER — ATORVASTATIN CALCIUM 20 MG PO TABS: 20.0000 mg | ORAL_TABLET | Freq: Every day | ORAL | 3 refills | Status: DC

## 2021-12-02 NOTE — Progress Notes (Signed)
Lower back pain 

## 2021-12-02 NOTE — Patient Instructions (Signed)

## 2021-12-02 NOTE — Progress Notes (Signed)
Subjective:  Patient ID: Brady Kim, male    DOB: Jan 05, 1966  Age: 56 y.o. MRN: [id number]  CC: Hospitalization Follow-up   HPI Brady Kim is a 56 y.o. year old male with a history of nicotine use disorder, alcohol use disorder,?previous stroke, Seizures use here to establish care. Hospitalized last month for syncope and alcohol withdrawal found to be hypoglycemic with electrolyte derangements. Troponins were negative, UDS negative, EKG unrevealing  CT head revealed: IMPRESSION: No acute intracranial process identified. 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   CT cervical spine revealed: IMPRESSION: 1. No fracture or dislocation. Absence of the normal lordotic curvature of the C-spine which may be due to pain, spasm or position.   2. Severe cervical spondylosis with large anterior marginal osteophytes/syndesmophytes and discogenic degenerative changes as above.     Interval History: He states he drinks Beer about a 12 pack twice and he does not feel this is a problem as he states he has cut back. He does endorse that the day of his syncope he had not had breakfast and had gone out on errands. I have discussed the results of his CT head with an old right MCA territory infarct him and he is unaware of a previous history of a stroke.  This morning he developed low back pain which he has had in the past but then symptoms resolved.  Pain does not radiate and he has no associated loss of sphincteric function or falls.  He is unsure is he slept wrong. In the past he received ESI which was ineffective Lumbar sine xray from 06/2017 IMPRESSION: 1. Slow interval progression of focal L4-L5 degenerative disc disease compared to 2016. 2. L3-L4 and L4-L5 facet arthropathy.   Past Medical History:  Diagnosis Date   Back pain    Degenerative disc disease    Hypertension     Past Surgical History:  Procedure Laterality Date   ANKLE FUSION     KNEE  ARTHROSCOPY      Family History  Problem Relation Age of Onset   Hypertension Mother    Diabetes Maternal Aunt    Cancer Maternal Aunt    Cancer Maternal Grandmother     Social History   Socioeconomic History   Marital status: Married    Spouse name: Not on file   Number of children: Not on file   Years of education: Not on file   Highest education level: Not on file  Occupational History   Not on file  Tobacco Use   Smoking status: Every Day    Packs/day: 1.00    Types: Cigarettes   Smokeless tobacco: Never  Substance and Sexual Activity   Alcohol use: Yes    Alcohol/week: 24.0 standard drinks of alcohol    Types: 24 Cans of beer per week   Drug use: No   Sexual activity: Not on file  Other Topics Concern   Not on file  Social History Narrative   Not on file   Social Determinants of Health   Financial Resource Strain: Not on file  Food Insecurity: Not on file  Transportation Needs: Not on file  Physical Activity: Not on file  Stress: Not on file  Social Connections: Not on file    No Known Allergies  Outpatient Medications Prior to Visit  Medication Sig Dispense Refill   amLODipine (NORVASC) 5 MG tablet Take 1 tablet (5 mg total) by mouth daily. 30 tablet 2   Cholecalciferol (VITAMIN D) 50 MCG (  2000 UT) CAPS Take 1 capsule (2,000 Units total) by mouth daily. (Patient not taking: Reported on 12/02/2021) 30 capsule    Multiple Vitamin (MULTIVITAMIN WITH MINERALS) TABS tablet Take 1 tablet by mouth daily. (Patient not taking: Reported on 12/02/2021)     nicotine (NICODERM CQ - DOSED IN MG/24 HOURS) 21 mg/24hr patch Place 1 patch (21 mg total) onto the skin daily as needed (Craving cigarettes). (Patient not taking: Reported on 12/02/2021) 28 patch 0   thiamine 100 MG tablet Take 1 tablet (100 mg total) by mouth daily. (Patient not taking: Reported on 12/02/2021) 30 tablet 1   No facility-administered medications prior to visit.     ROS Review of Systems   Constitutional:  Negative for activity change and appetite change.  HENT:  Negative for sinus pressure and sore throat.   Respiratory:  Negative for chest tightness, shortness of breath and wheezing.   Cardiovascular:  Negative for chest pain and palpitations.  Gastrointestinal:  Negative for abdominal distention, abdominal pain and constipation.  Genitourinary: Negative.   Musculoskeletal:        See HPI  Psychiatric/Behavioral:  Negative for behavioral problems and dysphoric mood.     Objective:  BP 112/77   Pulse 79   Temp 98 F (36.7 C) (Oral)   Ht 5\' 11"  (1.803 m)   Wt 161 lb 3.2 oz (73.1 kg)   SpO2 98%   BMI 22.48 kg/m      12/02/2021    9:51 AM 11/02/2021    8:44 AM 11/02/2021    5:54 AM  BP/Weight  Systolic BP 112 122 117  Diastolic BP 77 88 83  Wt. (Lbs) 161.2    BMI 22.48 kg/m2        Physical Exam Constitutional:      Appearance: He is well-developed.  Cardiovascular:     Rate and Rhythm: Normal rate.     Heart sounds: Normal heart sounds. No murmur heard. Pulmonary:     Effort: Pulmonary effort is normal.     Breath sounds: Normal breath sounds. No wheezing or rales.  Chest:     Chest wall: No tenderness.  Abdominal:     General: Bowel sounds are normal. There is no distension.     Palpations: Abdomen is soft. There is no mass.     Tenderness: There is no abdominal tenderness.  Musculoskeletal:        General: No tenderness. Normal range of motion.     Right lower leg: No edema.     Left lower leg: No edema.     Comments: Negative straight leg raise bilaterally  Neurological:     Mental Status: He is alert and oriented to person, place, and time.  Psychiatric:        Mood and Affect: Mood normal.        Latest Ref Rng & Units 11/02/2021    5:42 AM 11/01/2021    2:54 AM 10/31/2021    7:28 PM  CMP  Glucose 70 - 99 mg/dL 01/01/2022  782  956   BUN 6 - 20 mg/dL 6  7  12    Creatinine 0.61 - 1.24 mg/dL 213   0.86   Sodium 135 - 145 mmol/L 139  132   137   Potassium 3.5 - 5.1 mmol/L 3.8  3.9  4.2   Chloride 98 - 111 mmol/L 104  104  105   CO2 22 - 32 mmol/L 29  21  24    Calcium 8.9 -  10.3 mg/dL 8.7  8.7  10.1   Total Protein 6.5 - 8.1 g/dL  6.5    Total Bilirubin 0.3 - 1.2 mg/dL  0.7    Alkaline Phos 38 - 126 U/L  63    AST 15 - 41 U/L  17    ALT 0 - 44 U/L  29      Lipid Panel     Component Value Date/Time   CHOL 138 07/18/2020 1408   TRIG 163 (H) 07/18/2020 1408   HDL 65 07/18/2020 1408   CHOLHDL 2.1 07/18/2020 1408   CHOLHDL 2.2 03/14/2013 0935   VLDL 20 03/14/2013 0935   LDLCALC 46 07/18/2020 1408    CBC    Component Value Date/Time   WBC 8.1 11/01/2021 0254   RBC 4.40 11/01/2021 0254   HGB 13.9 11/01/2021 0254   HGB 15.0 07/18/2020 1408   HCT 41.3 11/01/2021 0254   HCT 44.5 07/18/2020 1408   PLT 191 11/01/2021 0254   PLT 228 07/18/2020 1408   MCV 93.9 11/01/2021 0254   MCV 94 07/18/2020 1408   MCH 31.6 11/01/2021 0254   MCHC 33.7 11/01/2021 0254   RDW 13.5 11/01/2021 0254   RDW 13.1 07/18/2020 1408   LYMPHSABS 2.3 08/21/2021 1937   LYMPHSABS 1.6 07/18/2020 1408   MONOABS 0.5 08/21/2021 1937   EOSABS 0.2 08/21/2021 1937   EOSABS 0.1 07/18/2020 1408   BASOSABS 0.1 08/21/2021 1937   BASOSABS 0.0 07/18/2020 1408    Lab Results  Component Value Date   HGBA1C 5.8 (A) 12/02/2021    Assessment & Plan:  1. Degenerative disc disease, lumbar Uncontrolled, status post ESI in the past We will place on Robaxin and if symptoms persist consider PT and possibly referral to orthopedic Advised to apply heat or ice whichever is tolerated to painful areas. Counseled on evidence of improvement in pain control with regards to yoga, water aerobics, massage, home physical therapy, exercise as tolerated. - methocarbamol (ROBAXIN-750) 750 MG tablet; Take 1 tablet (750 mg total) by mouth every 8 (eight) hours as needed for muscle spasms.  Dispense: 90 tablet; Refill: 1  2. Screening for diabetes mellitus Prediabetes with  A1c of eight 5.8 Work on lifestyle modifications to prevent progression to type 2 diabetes mellitus - POCT glycosylated hemoglobin (Hb A1C) - POCT glucose (manual entry)  3. Alcohol use disorder, severe, dependence ([place]) Counseled on cessation Not interested in quitting  4. Primary hypertension Controlled Counseled on blood pressure goal of less than 130/80, low-sodium, DASH diet, medication compliance, 150 minutes of moderate intensity exercise per week. Discussed medication compliance, adverse effects. - amLODipine (NORVASC) 5 MG tablet; Take 1 tablet (5 mg total) by mouth daily.  Dispense: 30 tablet; Refill: 6  5. History of stroke Evidence of old R MCA infarct on CT head Initiate statin for secondary prevention Risk factor modification - atorvastatin (LIPITOR) 20 MG tablet; Take 1 tablet (20 mg total) by mouth daily.  Dispense: 30 tablet; Refill: 3   Meds ordered this encounter  Medications   atorvastatin (LIPITOR) 20 MG tablet    Sig: Take 1 tablet (20 mg total) by mouth daily.    Dispense:  30 tablet    Refill:  3   methocarbamol (ROBAXIN-750) 750 MG tablet    Sig: Take 1 tablet (750 mg total) by mouth every 8 (eight) hours as needed for muscle spasms.    Dispense:  90 tablet    Refill:  1   amLODipine (NORVASC) 5 MG tablet  Sig: Take 1 tablet (5 mg total) by mouth daily.    Dispense:  30 tablet    Refill:  6    Follow-up: Return in about 3 months (around 03/04/2022) for Back pain.       Charlott Rakes, MD, FAAFP. [facility] and [place], [place]   12/02/2021, 10:25 AM

## 2022-02-02 ENCOUNTER — Other Ambulatory Visit: Payer: Self-pay | Admitting: Family Medicine

## 2022-02-02 DIAGNOSIS — M5136 Other intervertebral disc degeneration, lumbar region: Secondary | ICD-10-CM

## 2022-03-10 ENCOUNTER — Ambulatory Visit: Payer: Medicaid Other | Attending: Family Medicine | Admitting: Family Medicine

## 2022-03-10 ENCOUNTER — Encounter: Payer: Self-pay | Admitting: Family Medicine

## 2022-03-10 VITALS — BP 126/74 | HR 85 | Temp 98.0°F | Ht 71.0 in | Wt 168.0 lb

## 2022-03-10 DIAGNOSIS — K089 Disorder of teeth and supporting structures, unspecified: Secondary | ICD-10-CM

## 2022-03-10 DIAGNOSIS — Z8673 Personal history of transient ischemic attack (TIA), and cerebral infarction without residual deficits: Secondary | ICD-10-CM

## 2022-03-10 DIAGNOSIS — I10 Essential (primary) hypertension: Secondary | ICD-10-CM

## 2022-03-10 DIAGNOSIS — M5136 Other intervertebral disc degeneration, lumbar region: Secondary | ICD-10-CM | POA: Diagnosis not present

## 2022-03-10 DIAGNOSIS — Z23 Encounter for immunization: Secondary | ICD-10-CM | POA: Diagnosis not present

## 2022-03-10 DIAGNOSIS — M51369 Other intervertebral disc degeneration, lumbar region without mention of lumbar back pain or lower extremity pain: Secondary | ICD-10-CM

## 2022-03-10 DIAGNOSIS — H539 Unspecified visual disturbance: Secondary | ICD-10-CM

## 2022-03-10 DIAGNOSIS — Z1159 Encounter for screening for other viral diseases: Secondary | ICD-10-CM

## 2022-03-10 MED ORDER — METHOCARBAMOL 750 MG PO TABS: 750.0000 mg | ORAL_TABLET | Freq: Three times a day (TID) | ORAL | 1 refills | Status: DC | PRN

## 2022-03-10 MED ORDER — ATORVASTATIN CALCIUM 20 MG PO TABS: 20.0000 mg | ORAL_TABLET | Freq: Every day | ORAL | 6 refills | Status: DC

## 2022-03-10 MED ORDER — MELOXICAM 7.5 MG PO TABS: 7.5000 mg | ORAL_TABLET | Freq: Every day | ORAL | 1 refills | Status: DC

## 2022-03-10 MED ORDER — AMLODIPINE BESYLATE 5 MG PO TABS: 5.0000 mg | ORAL_TABLET | Freq: Every day | ORAL | 6 refills | Status: DC

## 2022-03-10 NOTE — Progress Notes (Signed)
Subjective:  Patient ID: Brady Kim, male    DOB: 1965-11-16  Age: 56 y.o. MRN: [id number]  CC: Back Pain   HPI Brady Kim is a 56 y.o. year old male with a history of nicotine use disorder, alcohol use disorder,?previous stroke, Seizures, lumbar spine DDD.  Interval History: Today he complains of back pain and has received epidural spinal injections in the past.  I had prescribed Robaxin at his last visit which he states was minimally effective. His pain is a 10/10 and he admits helping someone load a truck. Prior to the inciting agent he had pain intermittently.  Pain does not radiate down his lower extremities. He is not open to Physical Therapy as in the past it made his pain worse. He currently does not work and informs he he is trying to obtain SSI disability.  He is requesting ophthalmology referral due to abnormal vision in 5 but he wears glasses which need changing.  Also request dentist referral due to loose teeth and the fact that he has lost multiple teeth.  Endorses adherence with his statin and antihypertensive. Past Medical History:  Diagnosis Date   Back pain    Degenerative disc disease    Hypertension     Past Surgical History:  Procedure Laterality Date   ANKLE FUSION     KNEE ARTHROSCOPY      Family History  Problem Relation Age of Onset   Hypertension Mother    Diabetes Maternal Aunt    Cancer Maternal Aunt    Cancer Maternal Grandmother     Social History   Socioeconomic History   Marital status: Married    Spouse name: Not on file   Number of children: Not on file   Years of education: Not on file   Highest education level: Not on file  Occupational History   Not on file  Tobacco Use   Smoking status: Every Day    Packs/day: 1.00    Types: Cigarettes   Smokeless tobacco: Never  Substance and Sexual Activity   Alcohol use: Yes    Alcohol/week: 24.0 standard drinks of alcohol    Types: 24 Cans of beer per week   Drug use: No    Sexual activity: Not on file  Other Topics Concern   Not on file  Social History Narrative   Not on file   Social Determinants of Health   Financial Resource Strain: Not on file  Food Insecurity: Not on file  Transportation Needs: Not on file  Physical Activity: Not on file  Stress: Not on file  Social Connections: Not on file    No Known Allergies  Outpatient Medications Prior to Visit  Medication Sig Dispense Refill   Cholecalciferol (VITAMIN D) 50 MCG (2000 UT) CAPS Take 1 capsule (2,000 Units total) by mouth daily. 30 capsule    Multiple Vitamin (MULTIVITAMIN WITH MINERALS) TABS tablet Take 1 tablet by mouth daily.     thiamine 100 MG tablet Take 1 tablet (100 mg total) by mouth daily. 30 tablet 1   amLODipine (NORVASC) 5 MG tablet Take 1 tablet (5 mg total) by mouth daily. 30 tablet 6   atorvastatin (LIPITOR) 20 MG tablet Take 1 tablet (20 mg total) by mouth daily. 30 tablet 3   methocarbamol (ROBAXIN) 750 MG tablet TAKE 1 TABLET (750 MG TOTAL) BY MOUTH EVERY 8 (EIGHT) HOURS AS NEEDED FOR MUSCLE SPASMS. 90 tablet 1   nicotine (NICODERM CQ - DOSED IN MG/24 HOURS) 21 mg/24hr patch  Place 1 patch (21 mg total) onto the skin daily as needed (Craving cigarettes). (Patient not taking: Reported on 12/02/2021) 28 patch 0   No facility-administered medications prior to visit.     ROS Review of Systems  Constitutional:  Negative for activity change and appetite change.  HENT:  Negative for sinus pressure and sore throat.   Respiratory:  Negative for chest tightness, shortness of breath and wheezing.   Cardiovascular:  Negative for chest pain and palpitations.  Gastrointestinal:  Negative for abdominal distention, abdominal pain and constipation.  Genitourinary: Negative.   Musculoskeletal:        See HPI  Psychiatric/Behavioral:  Negative for behavioral problems and dysphoric mood.     Objective:  BP 126/74   Pulse 85   Temp 98 F (36.7 C) (Oral)   Ht _0  (1.803 m)   Wt  168 lb (76.2 kg)   SpO2 98%   BMI 23.43 kg/m      03/10/2022   10:36 AM 12/02/2021    9:51 AM 11/02/2021    8:44 AM  BP/Weight  Systolic BP 466 599 357  Diastolic BP 74 77 88  Wt. (Lbs) 168 161.2   BMI 23.43 kg/m2 22.48 kg/m2       Physical Exam Constitutional:      Appearance: He is well-developed.  Cardiovascular:     Rate and Rhythm: Normal rate.     Heart sounds: Normal heart sounds. No murmur heard. Pulmonary:     Effort: Pulmonary effort is normal.     Breath sounds: Normal breath sounds. No wheezing or rales.  Chest:     Chest wall: No tenderness.  Abdominal:     General: Bowel sounds are normal. There is no distension.     Palpations: Abdomen is soft. There is no mass.     Tenderness: There is no abdominal tenderness.  Musculoskeletal:        General: No tenderness. Normal range of motion.     Right lower leg: No edema.     Left lower leg: No edema.     Comments: Normal appearance of lumbar spine, no tenderness to palpation Negative bilateral straight leg raise  Neurological:     Mental Status: He is alert and oriented to person, place, and time.  Psychiatric:        Mood and Affect: Mood normal.        Latest Ref Rng & Units 11/02/2021    5:42 AM 11/01/2021    2:54 AM 10/31/2021    7:28 PM  CMP  Glucose 70 - 99 mg/dL 110  109  108   BUN 6 - 20 mg/dL _1 Creatinine 0.61 - 1.24 mg/dL 0.85  0.92  0.87   Sodium 135 - 145 mmol/L 139  132  137   Potassium 3.5 - 5.1 mmol/L 3.8  3.9  4.2   Chloride 98 - 111 mmol/L 104  104  105   CO2 22 - 32 mmol/L _2 Calcium 8.9 - 10.3 mg/dL 8.7  8.7  10.1   Total Protein 6.5 - 8.1 g/dL  6.5    Total Bilirubin 0.3 - 1.2 mg/dL  0.7    Alkaline Phos 38 - 126 U/L  63    AST 15 - 41 U/L  17    ALT 0 - 44 U/L  29      Lipid Panel     Component Value Date/Time  CHOL 138 07/18/2020 1408   TRIG 163 (H) 07/18/2020 1408   HDL 65 07/18/2020 1408   CHOLHDL 2.1 07/18/2020 1408   CHOLHDL 2.2 03/14/2013 0935    VLDL 20 03/14/2013 0935   LDLCALC 46 07/18/2020 1408    CBC    Component Value Date/Time   WBC 8.1 11/01/2021 0254   RBC 4.40 11/01/2021 0254   HGB 13.9 11/01/2021 0254   HGB 15.0 07/18/2020 1408   HCT 41.3 11/01/2021 0254   HCT 44.5 07/18/2020 1408   PLT 191 11/01/2021 0254   PLT 228 07/18/2020 1408   MCV 93.9 11/01/2021 0254   MCV 94 07/18/2020 1408   MCH 31.6 11/01/2021 0254   MCHC 33.7 11/01/2021 0254   RDW 13.5 11/01/2021 0254   RDW 13.1 07/18/2020 1408   LYMPHSABS 2.3 08/21/2021 1937   LYMPHSABS 1.6 07/18/2020 1408   MONOABS 0.5 08/21/2021 1937   EOSABS 0.2 08/21/2021 1937   EOSABS 0.1 07/18/2020 1408   BASOSABS 0.1 08/21/2021 1937   BASOSABS 0.0 07/18/2020 1408    Lab Results  Component Value Date   HGBA1C 5.8 (A) 12/02/2021    Assessment & Plan:  1. Primary hypertension Controlled Counseled on blood pressure goal of less than 130/80, low-sodium, DASH diet, medication compliance, 150 minutes of moderate intensity exercise per week. Discussed medication compliance, adverse effects. - amLODipine (NORVASC) 5 MG tablet; Take 1 tablet (5 mg total) by mouth daily.  Dispense: 30 tablet; Refill: 6 - CMP14+EGFR  2. History of stroke Risk factor modification including smoking cessation Continue statin - atorvastatin (LIPITOR) 20 MG tablet; Take 1 tablet (20 mg total) by mouth daily.  Dispense: 30 tablet; Refill: 6  3. Degenerative disc disease, lumbar Uncontrolled Acute flare triggered by heavy lifting He is not open to PT We will add on NSAID to his muscle relaxant Advised to apply heat or ice whichever is tolerated to painful areas. Counseled on evidence of improvement in pain control with regards to yoga, water aerobics, massage, home physical therapy, exercise as tolerated. - AMB referral to orthopedics - meloxicam (MOBIC) 7.5 MG tablet; Take 1 tablet (7.5 mg total) by mouth daily.  Dispense: 30 tablet; Refill: 1 - methocarbamol (ROBAXIN) 750 MG tablet; Take  1 tablet (750 mg total) by mouth every 8 (eight) hours as needed for muscle spasms.  Dispense: 90 tablet; Refill: 1  4. Vision abnormalities - Ambulatory referral to Ophthalmology  5. Poor dentition - Ambulatory referral to Dentistry  6. Need for hepatitis C screening test - HCV Ab w Reflex to Quant PCR  7. Need for immunization against influenza - Flu Vaccine QUAD 62moIM (Fluarix, Fluzone & Alfiuria Quad PF)    Meds ordered this encounter  Medications   meloxicam (MOBIC) 7.5 MG tablet    Sig: Take 1 tablet (7.5 mg total) by mouth daily.    Dispense:  30 tablet    Refill:  1   amLODipine (NORVASC) 5 MG tablet    Sig: Take 1 tablet (5 mg total) by mouth daily.    Dispense:  30 tablet    Refill:  6   atorvastatin (LIPITOR) 20 MG tablet    Sig: Take 1 tablet (20 mg total) by mouth daily.    Dispense:  30 tablet    Refill:  6   methocarbamol (ROBAXIN) 750 MG tablet    Sig: Take 1 tablet (750 mg total) by mouth every 8 (eight) hours as needed for muscle spasms.    Dispense:  90 tablet  Refill:  1    Follow-up: Return in about 6 months (around 09/08/2022) for Chronic medical conditions.       Charlott Rakes, MD, FAAFP. [facility] and [place], [place]   03/10/2022, 11:32 AM

## 2022-03-10 NOTE — Patient Instructions (Addendum)
Back Exercises The following exercises strengthen the muscles that help to support the trunk (torso) and back. They also help to keep the lower back flexible. Doing these exercises can help to prevent or lessen existing low back pain. If you have back pain or discomfort, try doing these exercises 2-3 times each day or as told by your health care provider. As your pain improves, do them once each day, but increase the number of times that you repeat the steps for each exercise (do more repetitions). To prevent the recurrence of back pain, continue to do these exercises once each day or as told by your health care provider. Do exercises exactly as told by your health care provider and adjust them as directed. It is normal to feel mild stretching, pulling, tightness, or discomfort as you do these exercises, but you should stop right away if you feel sudden pain or your pain gets worse. Exercises Single knee to chest Repeat these steps 3-5 times for each leg: Lie on your back on a firm bed or the floor with your legs extended. Bring one knee to your chest. Your other leg should stay extended and in contact with the floor. Hold your knee in place by grabbing your knee or thigh with both hands and hold. Pull on your knee until you feel a gentle stretch in your lower back or buttocks. Hold the stretch for 10-30 seconds. Slowly release and straighten your leg.  Pelvic tilt Repeat these steps 5-10 times: Lie on your back on a firm bed or the floor with your legs extended. Bend your knees so they are pointing toward the ceiling and your feet are flat on the floor. Tighten your lower abdominal muscles to press your lower back against the floor. This motion will tilt your pelvis so your tailbone points up toward the ceiling instead of pointing to your feet or the floor. With gentle tension and even breathing, hold this position for 5-10 seconds.  Cat-cow Repeat these steps until your lower back becomes  more flexible: Get into a hands-and-knees position on a firm bed or the floor. Keep your hands under your shoulders, and keep your knees under your hips. You may place padding under your knees for comfort. Let your head hang down toward your chest. Contract your abdominal muscles and point your tailbone toward the floor so your lower back becomes rounded like the back of a cat. Hold this position for 5 seconds. Slowly lift your head, let your abdominal muscles relax, and point your tailbone up toward the ceiling so your back forms a sagging arch like the back of a cow. Hold this position for 5 seconds.  Press-ups Repeat these steps 5-10 times: Lie on your abdomen (face-down) on a firm bed or the floor. Place your palms near your head, about shoulder-width apart. Keeping your back as relaxed as possible and keeping your hips on the floor, slowly straighten your arms to raise the top half of your body and lift your shoulders. Do not use your back muscles to raise your upper torso. You may adjust the placement of your hands to make yourself more comfortable. Hold this position for 5 seconds while you keep your back relaxed. Slowly return to lying flat on the floor.  Bridges Repeat these steps 10 times: Lie on your back on a firm bed or the floor. Bend your knees so they are pointing toward the ceiling and your feet are flat on the floor. Your arms should be flat   at your sides, next to your body. Tighten your buttocks muscles and lift your buttocks off the floor until your waist is at almost the same height as your knees. You should feel the muscles working in your buttocks and the back of your thighs. If you do not feel these muscles, slide your feet 1-2 inches (2.5-5 cm) farther away from your buttocks. Hold this position for 3-5 seconds. Slowly lower your hips to the starting position, and allow your buttocks muscles to relax completely. If this exercise is too easy, try doing it with your arms  crossed over your chest. Abdominal crunches Repeat these steps 5-10 times: Lie on your back on a firm bed or the floor with your legs extended. Bend your knees so they are pointing toward the ceiling and your feet are flat on the floor. Cross your arms over your chest. Tip your chin slightly toward your chest without bending your neck. Tighten your abdominal muscles and slowly raise your torso high enough to lift your shoulder blades a tiny bit off the floor. Avoid raising your torso higher than that because it can put too much stress on your lower back and does not help to strengthen your abdominal muscles. Slowly return to your starting position.  Back lifts Repeat these steps 5-10 times: Lie on your abdomen (face-down) with your arms at your sides, and rest your forehead on the floor. Tighten the muscles in your legs and your buttocks. Slowly lift your chest off the floor while you keep your hips pressed to the floor. Keep the back of your head in line with the curve in your back. Your eyes should be looking at the floor. Hold this position for 3-5 seconds. Slowly return to your starting position.  Contact a health care provider if: Your back pain or discomfort gets much worse when you do an exercise. Your worsening back pain or discomfort does not lessen within 2 hours after you exercise. If you have any of these problems, stop doing these exercises right away. Do not do them again unless your health care provider says that you can. Get help right away if: You develop sudden, severe back pain. If this happens, stop doing the exercises right away. Do not do them again unless your health care provider says that you can. This information is not intended to replace advice given to you by your health care provider. Make sure you discuss any questions you have with your health care provider. Document Revised: 10/08/2020 Document Reviewed: 06/26/2020 Elsevier Patient Education  2023 Elsevier  Inc. Influenza (Flu) Vaccine (Inactivated or Recombinant): What You Need to Know 1. Why get vaccinated? Influenza vaccine can prevent influenza (flu). Flu is a contagious disease that spreads around the [place] every year, usually between October and May. Anyone can get the flu, but it is more dangerous for some people. Infants and young children, people 14 years and older, pregnant people, and people with certain health conditions or a weakened immune system are at greatest risk of flu complications. Pneumonia, bronchitis, sinus infections, and ear infections are examples of flu-related complications. If you have a medical condition, such as heart disease, cancer, or diabetes, flu can make it worse. Flu can cause fever and chills, sore throat, muscle aches, fatigue, cough, headache, and runny or stuffy nose. Some people may have vomiting and diarrhea, though this is more common in children than adults. In an average year, thousands of people in the [place] die from flu, and many  more are hospitalized. Flu vaccine prevents millions of illnesses and flu-related visits to the doctor each year. 2. Influenza vaccines CDC recommends everyone 6 months and older get vaccinated every flu season. Children 6 months through 56 years of age may need 2 doses during a single flu season. Everyone else needs only 1 dose each flu season. It takes about 2 weeks for protection to develop after vaccination. There are many flu viruses, and they are always changing. Each year a new flu vaccine is made to protect against the influenza viruses believed to be likely to cause disease in the upcoming flu season. Even when the vaccine doesn't exactly match these viruses, it may still provide some protection. Influenza vaccine does not cause flu. Influenza vaccine may be given at the same time as other vaccines. 3. Talk with your health care provider Tell your vaccination provider if the person getting the  vaccine: Has had an allergic reaction after a previous dose of influenza vaccine, or has any severe, life-threatening allergies Has ever had Guillain-Barr Syndrome (also called "GBS") In some cases, your health care provider may decide to postpone influenza vaccination until a future visit. Influenza vaccine can be administered at any time during pregnancy. People who are or will be pregnant during influenza season should receive inactivated influenza vaccine. People with minor illnesses, such as a cold, may be vaccinated. People who are moderately or severely ill should usually wait until they recover before getting influenza vaccine. Your health care provider can give you more information. 4. Risks of a vaccine reaction Soreness, redness, and swelling where the shot is given, fever, muscle aches, and headache can happen after influenza vaccination. There may be a very small increased risk of Guillain-Barr Syndrome (GBS) after inactivated influenza vaccine (the flu shot). Young children who get the flu shot along with pneumococcal vaccine (PCV13) and/or DTaP vaccine at the same time might be slightly more likely to have a seizure caused by fever. Tell your health care provider if a child who is getting flu vaccine has ever had a seizure. People sometimes faint after medical procedures, including vaccination. Tell your provider if you feel dizzy or have vision changes or ringing in the ears. As with any medicine, there is a very remote chance of a vaccine causing a severe allergic reaction, other serious injury, or death. 5. What if there is a serious problem? An allergic reaction could occur after the vaccinated person leaves the clinic. If you see signs of a severe allergic reaction (hives, swelling of the face and throat, difficulty breathing, a fast heartbeat, dizziness, or weakness), call 9-1-1 and get the person to the nearest hospital. For other signs that concern you, call your health care  provider. Adverse reactions should be reported to the Vaccine Adverse Event Reporting System (VAERS). Your health care provider will usually file this report, or you can do it yourself. Visit the VAERS website at www.vaers.SamedayNews.es or call 548-839-0466. VAERS is only for reporting reactions, and VAERS staff members do not give medical advice. 6. The National Vaccine Injury Compensation Program The [employer] Vaccine Injury Compensation Program (VICP) is a federal program that was created to compensate people who may have been injured by certain vaccines. Claims regarding alleged injury or death due to vaccination have a time limit for filing, which may be as short as two years. Visit the VICP website at GoldCloset.com.ee or call 4342056309 to learn about the program and about filing a claim. 7. How can I learn more? Ask  your health care provider. Call your local or state health department. Visit the website of the Food and Drug Administration (FDA) for vaccine package inserts and additional information at TraderRating.uy. Contact the Centers for Disease Control and Prevention (CDC): Call 937-348-2608 (1-800-CDC-INFO) or Visit CDC's website at https://gibson.com/. Source: CDC Vaccine Information Statement Inactivated Influenza Vaccine (12/01/2019) This same material is available at http://www.wolf.info/ for no charge. This information is not intended to replace advice given to you by your health care provider. Make sure you discuss any questions you have with your health care provider. Document Revised: 03/11/2021 Document Reviewed: 01/02/2021 Elsevier Patient Education  [place].

## 2022-03-11 LAB — CMP14+EGFR
ALT: 48 IU/L — ABNORMAL HIGH (ref 0–44)
AST: 30 IU/L (ref 0–40)
Albumin/Globulin Ratio: 1.7 (ref 1.2–2.2)
Albumin: 5 g/dL — ABNORMAL HIGH (ref 3.8–4.9)
Alkaline Phosphatase: 91 IU/L (ref 44–121)
BUN/Creatinine Ratio: 12 (ref 9–20)
BUN: 12 mg/dL (ref 6–24)
Bilirubin Total: 0.5 mg/dL (ref 0.0–1.2)
CO2: 23 mmol/L (ref 20–29)
Calcium: 9.6 mg/dL (ref 8.7–10.2)
Chloride: 97 mmol/L (ref 96–106)
Creatinine, Ser: 1.03 mg/dL (ref 0.76–1.27)
Globulin, Total: 2.9 g/dL (ref 1.5–4.5)
Glucose: 82 mg/dL (ref 70–99)
Potassium: 4.4 mmol/L (ref 3.5–5.2)
Sodium: 138 mmol/L (ref 134–144)
Total Protein: 7.9 g/dL (ref 6.0–8.5)
eGFR: 86 mL/min/{1.73_m2} (ref >59)

## 2022-03-11 LAB — HCV INTERPRETATION

## 2022-03-11 LAB — HCV AB W REFLEX TO QUANT PCR: HCV Ab: NONREACTIVE

## 2022-03-12 ENCOUNTER — Ambulatory Visit: Payer: Self-pay

## 2022-03-12 ENCOUNTER — Ambulatory Visit (INDEPENDENT_AMBULATORY_CARE_PROVIDER_SITE_OTHER): Payer: Medicaid Other | Admitting: Surgery

## 2022-03-12 ENCOUNTER — Telehealth: Payer: Self-pay

## 2022-03-12 DIAGNOSIS — G8929 Other chronic pain: Secondary | ICD-10-CM | POA: Diagnosis not present

## 2022-03-12 DIAGNOSIS — M4726 Other spondylosis with radiculopathy, lumbar region: Secondary | ICD-10-CM | POA: Diagnosis not present

## 2022-03-12 DIAGNOSIS — M545 Low back pain, unspecified: Secondary | ICD-10-CM | POA: Diagnosis not present

## 2022-03-12 NOTE — Progress Notes (Signed)
Office Visit Note   Patient: Brady Kim           Date of Birth: 11/20/1965           MRN: [id number] Visit Date: 03/12/2022              Requested by: Hoy Register, MD [address] [place],  [place] [address] PCP: Patient, No Pcp Per   Assessment & Plan: Visit Diagnoses:  1. Chronic bilateral low back pain without sciatica   2. Other spondylosis with radiculopathy, lumbar region     Plan: With patient's ongoing chronic lumbar symptoms I will schedule lumbar MRI to rule out HNP/stenosis.  Follow-up with Dr. Ophelia Charter in 4 weeks for recheck to discuss results and further treatment options.  Follow-Up Instructions: Return in about 4 weeks (around 04/09/2022) for WITH DR YATES TO REVIEW LUMBAR MRI AND DISCUSS OPTIONS.   Orders:  Orders Placed This Encounter  Procedures   XR Lumbar Spine 2-3 Views   MR Lumbar Spine w/o contrast   No orders of the defined types were placed in this encounter.     Procedures: No procedures performed   Clinical Data: No additional findings.   Subjective: Chief Complaint  Patient presents with   Lower Back - Pain    HPI 56 year old black male comes in with complaints of chronic and worsening low back pain and intermittent bilateral lower extremity radiculopathy.  He has had ongoing issues 10+ years.  He had lumbar MRI performed March 18, 2013 and that study showed: FINDINGS:  Negative for fracture or mass lesion. Conus medullaris is normal and  terminates at L1-2.   L1-2:  Negative   L2-3:  Negative   L3-4: Small left lateral disc protrusion, unchanged from the prior  study.   L4-5: Progression of disc degeneration. Progressive disc space  narrowing and spondylosis. Improvement in right-sided disc  protrusion since the prior study. Improvement in right lateral  recess encroachment. There remains foraminal encroachment  bilaterally due to spurring. Bone marrow edema is present at L4-5  which has progressed and  is due to discogenic edema.   L5-S1:  Negative   IMPRESSION:  Small left lateral disc protrusion L3-4 is unchanged.   Progression of disc degeneration and spondylosis at L4-5 with  foraminal encroachment bilaterally. There is improvement in  right-sided disc protrusion since 2010.    Electronically Signed    By: Marlan Palau M.D.    On: 03/18/2013 17:11   He did have a lumbar ESI March 27, 2013.  States that he does not recall that given significant improvement.  He was told at 1 point that he would likely need to see a spine surgeon but has not until our office visit today.  He was seen by his PCP a couple days ago and referred here.  Continues have ongoing chronic worsening low back pain.  Occasionally pain radiates down both legs.  He is not currently employed and has been out of work the last several years.  He has had multiple visits to the ED.  Review of Systems No current complaints of cardiopulmonary GI/GU issues  Objective: Vital Signs: There were no vitals taken for this visit.  Physical Exam HENT:     Head: Normocephalic and atraumatic.     Nose: Nose normal.  Eyes:     Extraocular Movements: Extraocular movements intact.  Pulmonary:     Effort: No respiratory distress.  Musculoskeletal:     Comments: Gait is  normal.  Low back pain with lumbar extension.  Lumbar flexion hands ankles with some discomfort.  No lumbar paraspinal tenderness.  Negative log bilateral hips.  Negative straight leg raise.  No focal motor deficits.  Neurological:     Mental Status: He is alert and oriented to person, place, and time.  Psychiatric:        Mood and Affect: Mood normal.     Ortho Exam  Specialty Comments:  No specialty comments available.  Imaging: No results found.   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/21/2015   Right knee pain 06/27/2013   Tobacco use disorder 06/27/2013   Chronic low back pain 03/16/2013   Past Medical History:  Diagnosis Date   Back pain    Degenerative disc disease    Hypertension     Family History  Problem Relation Age of Onset   Hypertension Mother    Diabetes Maternal Aunt    Cancer Maternal Aunt    Cancer Maternal Grandmother     Past Surgical History:  Procedure Laterality Date   ANKLE FUSION     KNEE ARTHROSCOPY     Social History   Occupational History   Not on file  Tobacco Use   Smoking status: Every Day    Packs/day: 1.00    Types: Cigarettes   Smokeless tobacco: Never  Substance and Sexual Activity   Alcohol use: Yes    Alcohol/week: 24.0 standard drinks of alcohol    Types: 24 Cans of beer per week   Drug use: No   Sexual activity: Not on file

## 2022-04-04 ENCOUNTER — Other Ambulatory Visit: Payer: Medicaid Other

## 2022-04-07 NOTE — Telephone Encounter (Signed)
error 

## 2022-04-08 ENCOUNTER — Ambulatory Visit: Payer: Medicaid Other | Admitting: Orthopaedic Surgery

## 2022-04-30 ENCOUNTER — Other Ambulatory Visit: Payer: Self-pay | Admitting: Family Medicine

## 2022-04-30 DIAGNOSIS — M5136 Other intervertebral disc degeneration, lumbar region: Secondary | ICD-10-CM

## 2022-09-05 ENCOUNTER — Other Ambulatory Visit: Payer: Self-pay | Admitting: Family Medicine

## 2022-09-05 DIAGNOSIS — M5136 Other intervertebral disc degeneration, lumbar region: Secondary | ICD-10-CM

## 2022-09-07 NOTE — Telephone Encounter (Signed)
Requested Prescriptions  Pending Prescriptions Disp Refills   meloxicam (MOBIC) 7.5 MG tablet [Pharmacy Med Name: MELOXICAM 7.5 MG TABLET] 30 tablet 2    Sig: TAKE 1 TABLET BY MOUTH EVERY DAY     Analgesics:  COX2 Inhibitors Failed - 09/05/2022 12:23 AM      Failed - Manual Review: Labs are only required if the patient has taken medication for more than 8 weeks.      Failed - ALT in normal range and within 360 days    ALT  Date Value Ref Range Status  03/10/2022 48 (H) 0 - 44 IU/L Final         Passed - HGB in normal range and within 360 days    Hemoglobin  Date Value Ref Range Status  11/01/2021 13.9 13.0 - 17.0 g/dL Final  16/01/9603 54.0 13.0 - 17.7 g/dL Final         Passed - Cr in normal range and within 360 days    Creat  Date Value Ref Range Status  01/10/2014 1.01 0.50 - 1.35 mg/dL Final   Creatinine, Ser  Date Value Ref Range Status  03/10/2022 1.03 0.76 - 1.27 mg/dL Final   Creatinine,U  Date Value Ref Range Status  03/14/2013 189.0 mg/dL Final    Comment:      Cutoff Values for Urine Drug Screen:         Drug Class           Cutoff (ng/mL)         Amphetamines            1000         Barbiturates             200         Cocaine Metabolites      300         Benzodiazepines          200         Methadone                300         Opiates                 2000         Phencyclidine             25         Propoxyphene             300         Marijuana Metabolites     50   For medical purposes only.         Passed - HCT in normal range and within 360 days    HCT  Date Value Ref Range Status  11/01/2021 41.3 39.0 - 52.0 % Final   Hematocrit  Date Value Ref Range Status  07/18/2020 44.5 37.5 - 51.0 % Final         Passed - AST in normal range and within 360 days    AST  Date Value Ref Range Status  03/10/2022 30 0 - 40 IU/L Final         Passed - eGFR is 30 or above and within 360 days    GFR, Est African American  Date Value Ref Range Status   01/10/2014 >89 mL/min Final   GFR calc Af Amer  Date Value Ref Range Status  10/20/2018 >60 >60 mL/min Final   GFR, Est Non African  American  Date Value Ref Range Status  01/10/2014 88 mL/min Final    Comment:      The estimated GFR is a calculation valid for adults (>=36 years old) that uses the CKD-EPI algorithm to adjust for age and sex. It is   not to be used for children, pregnant women, hospitalized patients,    patients on dialysis, or with rapidly changing kidney function. According to the NKDEP, eGFR >89 is normal, 60-89 shows mild impairment, 30-59 shows moderate impairment, 15-29 shows severe impairment and <15 is ESRD.     GFR, Estimated  Date Value Ref Range Status  11/02/2021 >60 >60 mL/min Final    Comment:    (NOTE) Calculated using the CKD-EPI Creatinine Equation (2021)    eGFR  Date Value Ref Range Status  03/10/2022 86 >59 mL/min/1.73 Final         Passed - Patient is not pregnant      Passed - Valid encounter within last 12 months    Recent Outpatient Visits           6 months ago Vision abnormalities   [facility] [place], Odette Horns, MD   9 months ago Screening for diabetes mellitus   [place] [facility] Hoy Register, MD   2 years ago Screening for diabetes mellitus   [place] [facility] [place], [place], [place]   8 years ago Pre-diabetes   [facility] [facility] Doris Cheadle, MD   8 years ago Headache(784.0)   [place] [facility] Doris Cheadle, MD       Future Appointments             Tomorrow Hoy Register, MD [facility]

## 2022-09-08 ENCOUNTER — Ambulatory Visit: Payer: Self-pay | Attending: Family Medicine | Admitting: Family Medicine

## 2022-09-08 ENCOUNTER — Encounter: Payer: Self-pay | Admitting: Family Medicine

## 2022-09-08 VITALS — BP 134/78 | HR 74 | Ht 71.0 in | Wt 158.0 lb

## 2022-09-08 DIAGNOSIS — F17210 Nicotine dependence, cigarettes, uncomplicated: Secondary | ICD-10-CM

## 2022-09-08 DIAGNOSIS — Z1211 Encounter for screening for malignant neoplasm of colon: Secondary | ICD-10-CM

## 2022-09-08 DIAGNOSIS — Z8673 Personal history of transient ischemic attack (TIA), and cerebral infarction without residual deficits: Secondary | ICD-10-CM

## 2022-09-08 DIAGNOSIS — M51369 Other intervertebral disc degeneration, lumbar region without mention of lumbar back pain or lower extremity pain: Secondary | ICD-10-CM

## 2022-09-08 DIAGNOSIS — I10 Essential (primary) hypertension: Secondary | ICD-10-CM

## 2022-09-08 DIAGNOSIS — M5136 Other intervertebral disc degeneration, lumbar region: Secondary | ICD-10-CM

## 2022-09-08 DIAGNOSIS — R1011 Right upper quadrant pain: Secondary | ICD-10-CM

## 2022-09-08 DIAGNOSIS — Z125 Encounter for screening for malignant neoplasm of prostate: Secondary | ICD-10-CM

## 2022-09-08 MED ORDER — METHOCARBAMOL 750 MG PO TABS
750.0000 mg | ORAL_TABLET | Freq: Three times a day (TID) | ORAL | 1 refills | Status: AC | PRN
Start: 2022-09-08 — End: ?

## 2022-09-08 MED ORDER — LIDOCAINE 5 % EX PTCH
1.0000 | MEDICATED_PATCH | CUTANEOUS | 0 refills | Status: AC
Start: 2022-09-08 — End: ?

## 2022-09-08 MED ORDER — DULOXETINE HCL 60 MG PO CPEP: 60.0000 mg | ORAL_CAPSULE | Freq: Every day | ORAL | 3 refills | Status: DC

## 2022-09-08 MED ORDER — ATORVASTATIN CALCIUM 20 MG PO TABS: 20.0000 mg | ORAL_TABLET | Freq: Every day | ORAL | 6 refills | Status: DC

## 2022-09-08 MED ORDER — AMLODIPINE BESYLATE 5 MG PO TABS: 5.0000 mg | ORAL_TABLET | Freq: Every day | ORAL | 6 refills | Status: DC

## 2022-09-08 MED ORDER — MELOXICAM 7.5 MG PO TABS: 7.5000 mg | ORAL_TABLET | Freq: Every day | ORAL | 2 refills | Status: DC

## 2022-09-08 NOTE — Progress Notes (Signed)
Subjective:  Patient ID: Brady Kim, male    DOB: 1965/08/20  Age: 57 y.o. MRN: [id number]  CC: Hypertension   HPI Brady Kim is a 57 y.o. year old male with a history of dependence 10 Cigarettes/day x 57 years), alcohol dependence, seizures, degenerative disc disease of lumbar spine, possible previous stroke.  Interval History:  His right side has been hurting x1 month is worse when he pushes on his rib cage.  He has no nausea, vomiting, constipation or diarrhea.  He endorses ongoing alcohol use. His RIGHT LOWER EXTREMITY also gives out sometimes and he thinks it is sometimes swollen. He declined Physical Therapy referral at his last visit. He has numbness in his thigh and feels like he is about to fall. He has DDD of his lumbar spine. Seen by Cyndia Skeeters in 02/2022 and MRI lumbar spine ordered which he never underwent as he did not have medical coverage. He is applying for disability. Past Medical History:  Diagnosis Date   Back pain    Degenerative disc disease    Hypertension     Past Surgical History:  Procedure Laterality Date   ANKLE FUSION     KNEE ARTHROSCOPY      Family History  Problem Relation Age of Onset   Hypertension Mother    Diabetes Maternal Aunt    Cancer Maternal Aunt    Cancer Maternal Grandmother     Social History   Socioeconomic History   Marital status: Married    Spouse name: Not on file   Number of children: Not on file   Years of education: Not on file   Highest education level: Not on file  Occupational History   Not on file  Tobacco Use   Smoking status: Every Day    Packs/day: 1    Types: Cigarettes   Smokeless tobacco: Never  Substance and Sexual Activity   Alcohol use: Yes    Alcohol/week: 24.0 standard drinks of alcohol    Types: 24 Cans of beer per week   Drug use: No   Sexual activity: Not on file  Other Topics Concern   Not on file  Social History Narrative   Not on file   Social Determinants of Health    Financial Resource Strain: Not on file  Food Insecurity: Not on file  Transportation Needs: Not on file  Physical Activity: Not on file  Stress: Not on file  Social Connections: Not on file    No Known Allergies  Outpatient Medications Prior to Visit  Medication Sig Dispense Refill   Cholecalciferol (VITAMIN D) 50 MCG (2000 UT) CAPS Take 1 capsule (2,000 Units total) by mouth daily. 30 capsule    Multiple Vitamin (MULTIVITAMIN WITH MINERALS) TABS tablet Take 1 tablet by mouth daily.     nicotine (NICODERM CQ - DOSED IN MG/24 HOURS) 21 mg/24hr patch Place 1 patch (21 mg total) onto the skin daily as needed (Craving cigarettes). 28 patch 0   thiamine 100 MG tablet Take 1 tablet (100 mg total) by mouth daily. 30 tablet 1   amLODipine (NORVASC) 5 MG tablet Take 1 tablet (5 mg total) by mouth daily. 30 tablet 6   atorvastatin (LIPITOR) 20 MG tablet Take 1 tablet (20 mg total) by mouth daily. 30 tablet 6   meloxicam (MOBIC) 7.5 MG tablet TAKE 1 TABLET BY MOUTH EVERY DAY 30 tablet 2   methocarbamol (ROBAXIN) 750 MG tablet Take 1 tablet (750 mg total) by mouth every 8 (eight) hours as  needed for muscle spasms. 90 tablet 1   No facility-administered medications prior to visit.     ROS Review of Systems  Constitutional:  Negative for activity change and appetite change.  HENT:  Negative for sinus pressure and sore throat.   Respiratory:  Negative for chest tightness, shortness of breath and wheezing.   Cardiovascular:  Negative for chest pain and palpitations.  Gastrointestinal:  Negative for abdominal distention, abdominal pain and constipation.  Genitourinary: Negative.   Musculoskeletal:  Positive for joint swelling.       See HPI  Psychiatric/Behavioral:  Negative for behavioral problems and dysphoric mood.     Objective:  BP 134/78   Pulse 74   Ht 5\' 11"  (1.803 m)   Wt 158 lb (71.7 kg)   SpO2 99%   BMI 22.04 kg/m      09/08/2022   10:24 AM 03/10/2022   10:36 AM  12/02/2021    9:51 AM  BP/Weight  Systolic BP 134 126 112  Diastolic BP 78 74 77  Wt. (Lbs) 158 168 161.2  BMI 22.04 kg/m2 23.43 kg/m2 22.48 kg/m2      Physical Exam Constitutional:      Appearance: He is well-developed.  Cardiovascular:     Rate and Rhythm: Normal rate.     Heart sounds: Normal heart sounds. No murmur heard. Pulmonary:     Effort: Pulmonary effort is normal.     Breath sounds: Normal breath sounds. No wheezing or rales.  Chest:     Chest wall: Tenderness (lower half or R ribcage) present.  Abdominal:     General: Bowel sounds are normal. There is no distension.     Palpations: Abdomen is soft. There is no mass.     Tenderness: There is no abdominal tenderness.  Musculoskeletal:        General: Normal range of motion.     Right lower leg: No edema.     Left lower leg: No edema.     Comments: Negative straight leg raise bilaterally  Neurological:     Mental Status: He is alert and oriented to person, place, and time.  Psychiatric:        Mood and Affect: Mood normal.        Latest Ref Rng & Units 03/10/2022   11:20 AM 11/02/2021    5:42 AM 11/01/2021    2:54 AM  CMP  Glucose 70 - 99 mg/dL 82  161  096   BUN 6 - 24 mg/dL 12  6  7    Creatinine 0.76 - 1.27 mg/dL 0.45  4.09  8.11   Sodium 134 - 144 mmol/L 138  139  132   Potassium 3.5 - 5.2 mmol/L 4.4  3.8  3.9   Chloride 96 - 106 mmol/L 97  104  104   CO2 20 - 29 mmol/L 23  29  21    Calcium 8.7 - 10.2 mg/dL 9.6  8.7  8.7   Total Protein 6.0 - 8.5 g/dL 7.9   6.5   Total Bilirubin 0.0 - 1.2 mg/dL 0.5   0.7   Alkaline Phos 44 - 121 IU/L 91   63   AST 0 - 40 IU/L 30   17   ALT 0 - 44 IU/L 48   29     Lipid Panel     Component Value Date/Time   CHOL 138 07/18/2020 1408   TRIG 163 (H) 07/18/2020 1408   HDL 65 07/18/2020 1408   CHOLHDL 2.1  07/18/2020 1408   CHOLHDL 2.2 03/14/2013 0935   VLDL 20 03/14/2013 0935   LDLCALC 46 07/18/2020 1408    CBC    Component Value Date/Time   WBC 8.1  11/01/2021 0254   RBC 4.40 11/01/2021 0254   HGB 13.9 11/01/2021 0254   HGB 15.0 07/18/2020 1408   HCT 41.3 11/01/2021 0254   HCT 44.5 07/18/2020 1408   PLT 191 11/01/2021 0254   PLT 228 07/18/2020 1408   MCV 93.9 11/01/2021 0254   MCV 94 07/18/2020 1408   MCH 31.6 11/01/2021 0254   MCHC 33.7 11/01/2021 0254   RDW 13.5 11/01/2021 0254   RDW 13.1 07/18/2020 1408   LYMPHSABS 2.3 08/21/2021 1937   LYMPHSABS 1.6 07/18/2020 1408   MONOABS 0.5 08/21/2021 1937   EOSABS 0.2 08/21/2021 1937   EOSABS 0.1 07/18/2020 1408   BASOSABS 0.1 08/21/2021 1937   BASOSABS 0.0 07/18/2020 1408    Lab Results  Component Value Date   HGBA1C 5.8 (A) 12/02/2021    Assessment & Plan:  1. Primary hypertension Controlled Continue amlodipine Counseled on blood pressure goal of less than 130/80, low-sodium, DASH diet, medication compliance, 150 minutes of moderate intensity exercise per week. Discussed medication compliance, adverse effects. - LP+Non-HDL Cholesterol - CMP14+EGFR - CBC with Differential/Platelet - amLODipine (NORVASC) 5 MG tablet; Take 1 tablet (5 mg total) by mouth daily.  Dispense: 30 tablet; Refill: 6  2. History of stroke Has no residual deficits - atorvastatin (LIPITOR) 20 MG tablet; Take 1 tablet (20 mg total) by mouth daily.  Dispense: 30 tablet; Refill: 6  3. Degenerative disc disease, lumbar He is experiencing some lumbar radiculopathy Seen by orthopedic but unfortunately unable to undergo MRI due to lack of medical coverage Will refer for PT Duloxetine added to regimen and he will continue meloxicam and Robaxin - DULoxetine (CYMBALTA) 60 MG capsule; Take 1 capsule (60 mg total) by mouth daily. For chronic pain  Dispense: 30 capsule; Refill: 3 - meloxicam (MOBIC) 7.5 MG tablet; Take 1 tablet (7.5 mg total) by mouth daily.  Dispense: 30 tablet; Refill: 2 - methocarbamol (ROBAXIN) 750 MG tablet; Take 1 tablet (750 mg total) by mouth every 8 (eight) hours as needed for muscle  spasms.  Dispense: 90 tablet; Refill: 1  4. RUQ pain Pain is more on palpation of the lower half of his right rib cage He will be undergoing CT chest to evaluate for lung cancer hence I will not need to order rib x-ray - lidocaine (LIDODERM) 5 %; Place 1 patch onto the skin daily. Remove & Discard patch within 12 hours or as directed by MD  Dispense: 30 patch; Refill: 0  5. Smoking greater than 20 pack years Spent 3 minutes counseling on smoking cessation but he is not ready to quit - CT CHEST LUNG CANCER SCREENING LOW DOSE WO CONTRAST; Future  6. Screening for prostate cancer - PSA, total and free  7. Screening for colon cancer - Fecal occult blood, imunochemical([facility])    Meds ordered this encounter  Medications   DULoxetine (CYMBALTA) 60 MG capsule    Sig: Take 1 capsule (60 mg total) by mouth daily. For chronic pain    Dispense:  30 capsule    Refill:  3   lidocaine (LIDODERM) 5 %    Sig: Place 1 patch onto the skin daily. Remove & Discard patch within 12 hours or as directed by MD    Dispense:  30 patch    Refill:  0  amLODipine (NORVASC) 5 MG tablet    Sig: Take 1 tablet (5 mg total) by mouth daily.    Dispense:  30 tablet    Refill:  6   atorvastatin (LIPITOR) 20 MG tablet    Sig: Take 1 tablet (20 mg total) by mouth daily.    Dispense:  30 tablet    Refill:  6   meloxicam (MOBIC) 7.5 MG tablet    Sig: Take 1 tablet (7.5 mg total) by mouth daily.    Dispense:  30 tablet    Refill:  2   methocarbamol (ROBAXIN) 750 MG tablet    Sig: Take 1 tablet (750 mg total) by mouth every 8 (eight) hours as needed for muscle spasms.    Dispense:  90 tablet    Refill:  1    Follow-up: Return in about 6 months (around 03/11/2023) for Chronic medical conditions.       Hoy Register, MD, FAAFP. [facility] [place], [place] [phone]   09/08/2022, 12:02 PM

## 2022-09-08 NOTE — Progress Notes (Signed)
Pain in right ribcage and right leg.

## 2022-09-08 NOTE — Patient Instructions (Signed)

## 2022-09-09 LAB — CMP14+EGFR
ALT: 56 IU/L — ABNORMAL HIGH (ref 0–44)
AST: 55 IU/L — ABNORMAL HIGH (ref 0–40)
Albumin/Globulin Ratio: 1.7 (ref 1.2–2.2)
Albumin: 4.5 g/dL (ref 3.8–4.9)
Alkaline Phosphatase: 99 IU/L (ref 44–121)
BUN/Creatinine Ratio: 9 (ref 9–20)
BUN: 7 mg/dL (ref 6–24)
Bilirubin Total: 0.3 mg/dL (ref 0.0–1.2)
CO2: 23 mmol/L (ref 20–29)
Calcium: 9.2 mg/dL (ref 8.7–10.2)
Chloride: 100 mmol/L (ref 96–106)
Creatinine, Ser: 0.8 mg/dL (ref 0.76–1.27)
Globulin, Total: 2.7 g/dL (ref 1.5–4.5)
Glucose: 103 mg/dL — ABNORMAL HIGH (ref 70–99)
Potassium: 4.2 mmol/L (ref 3.5–5.2)
Sodium: 138 mmol/L (ref 134–144)
Total Protein: 7.2 g/dL (ref 6.0–8.5)
eGFR: 104 mL/min/{1.73_m2} (ref >59)

## 2022-09-09 LAB — CBC WITH DIFFERENTIAL/PLATELET
Basophils Absolute: 0 10*3/uL (ref 0.0–0.2)
Basos: 1 %
EOS (ABSOLUTE): 0.1 10*3/uL (ref 0.0–0.4)
Eos: 2 %
Hematocrit: 44.1 % (ref 37.5–51.0)
Hemoglobin: 14.8 g/dL (ref 13.0–17.7)
Immature Grans (Abs): 0 10*3/uL (ref 0.0–0.1)
Immature Granulocytes: 0 %
Lymphocytes Absolute: 1.5 10*3/uL (ref 0.7–3.1)
Lymphs: 34 %
MCH: 30.9 pg (ref 26.6–33.0)
MCHC: 33.6 g/dL (ref 31.5–35.7)
MCV: 92 fL (ref 79–97)
Monocytes Absolute: 0.5 10*3/uL (ref 0.1–0.9)
Monocytes: 10 %
Neutrophils Absolute: 2.4 10*3/uL (ref 1.4–7.0)
Neutrophils: 53 %
Platelets: 180 10*3/uL (ref 150–450)
RBC: 4.79 x10E6/uL (ref 4.14–5.80)
RDW: 13.6 % (ref 11.6–15.4)
WBC: 4.4 10*3/uL (ref 3.4–10.8)

## 2022-09-09 LAB — LP+NON-HDL CHOLESTEROL
Cholesterol, Total: 132 mg/dL (ref 100–199)
HDL: 90 mg/dL (ref >39)
LDL Chol Calc (NIH): 24 mg/dL (ref 0–99)
Total Non-HDL-Chol (LDL+VLDL): 42 mg/dL (ref 0–129)
Triglycerides: 100 mg/dL (ref 0–149)
VLDL Cholesterol Cal: 18 mg/dL (ref 5–40)

## 2022-09-09 LAB — PSA, TOTAL AND FREE
PSA, Free Pct: 33.5 %
PSA, Free: 0.57 ng/mL
Prostate Specific Ag, Serum: 1.7 ng/mL (ref 0.0–4.0)

## 2022-09-13 LAB — FECAL OCCULT BLOOD, IMMUNOCHEMICAL: Fecal Occult Bld: NEGATIVE

## 2022-09-24 ENCOUNTER — Ambulatory Visit: Payer: Self-pay | Admitting: *Deleted

## 2022-09-24 NOTE — Telephone Encounter (Signed)
  Chief Complaint: Called in and was given his lab results from Dr. Alvis Lemmings. Symptoms: N/ Frequency: N/A Pertinent Negatives: Patient denies N/A Disposition: [] ED /[] Urgent Care (no appt availability in office) / [] Appointment(In office/virtual)/ []  [place] [facility]/ [x] Home Care/ [] Refused Recommended Disposition /[] [place] [facility]/ []  Follow-up with PCP Additional Notes:

## 2022-09-24 NOTE — Telephone Encounter (Signed)
Reason for Disposition  [1] Follow-up call to recent contact AND [2] information only call, no triage required  Answer Assessment - Initial Assessment Questions 1. REASON FOR CALL or QUESTION: "What is your reason for calling today?" or "How can I best help you?" or "What question do you have that I can help answer?"     Pt called in and was given his lab result messages from Dr. Darin Engels 09/09/2022 at 8:55 AM and 09/14/2022 at 9:34 AM.  No questions from pt.  Protocols used: Information Only Call - No Triage-A-AH

## 2022-10-21 ENCOUNTER — Ambulatory Visit (HOSPITAL_COMMUNITY): Payer: Medicaid - Out of State

## 2022-10-21 ENCOUNTER — Encounter (HOSPITAL_COMMUNITY): Payer: Self-pay

## 2023-03-11 ENCOUNTER — Ambulatory Visit: Payer: Medicaid - Out of State | Attending: Family Medicine | Admitting: Family Medicine

## 2023-03-11 ENCOUNTER — Encounter: Payer: Self-pay | Admitting: Family Medicine

## 2023-03-11 VITALS — BP 134/74 | HR 85 | Ht 71.0 in | Wt 159.2 lb

## 2023-03-11 DIAGNOSIS — Z8673 Personal history of transient ischemic attack (TIA), and cerebral infarction without residual deficits: Secondary | ICD-10-CM

## 2023-03-11 DIAGNOSIS — M51369 Other intervertebral disc degeneration, lumbar region without mention of lumbar back pain or lower extremity pain: Secondary | ICD-10-CM

## 2023-03-11 DIAGNOSIS — I10 Essential (primary) hypertension: Secondary | ICD-10-CM

## 2023-03-11 DIAGNOSIS — K089 Disorder of teeth and supporting structures, unspecified: Secondary | ICD-10-CM

## 2023-03-11 MED ORDER — AMLODIPINE BESYLATE 5 MG PO TABS: 5.0000 mg | ORAL_TABLET | Freq: Every day | ORAL | 1 refills | Status: DC

## 2023-03-11 MED ORDER — ATORVASTATIN CALCIUM 20 MG PO TABS: 20.0000 mg | ORAL_TABLET | Freq: Every day | ORAL | 1 refills | Status: DC

## 2023-03-11 MED ORDER — DULOXETINE HCL 60 MG PO CPEP: 60.0000 mg | ORAL_CAPSULE | Freq: Every day | ORAL | 1 refills | Status: DC

## 2023-03-11 NOTE — Progress Notes (Signed)
Subjective:  Patient ID: Brady Kim, male    DOB: April 03, 1966  Age: 57 y.o. MRN: [id number]  CC: Medical Management of Chronic Issues (Dental pain/)   HPI Brady Kim is a 57 y.o. year old male with a history of nicotine dependence (10 cigarettes/day x 40 years), alcohol dependence, seizures, degenerative disc disease of lumbar spine, MCA infarct (from CT head 10/2021)  Interval History: Discussed the use of AI scribe software for clinical note transcription with the patient, who gave verbal consent to proceed.  He presents for medication refills. He reports good control of his hypertension on amlodipine and his hyperlipidemia on atorvastatin. He also takes duloxetine 60mg  for back pain from degenerative disc disease, which he reports he had run out of he also takes meloxicam and a muscle relaxant, which he has enough of at home.   The patient currently drinks alcohol once or twice a week, consuming about a six pack each time. He has been smoking for over forty years and currently smokes about two packs a week.  The patient also reports tooth pain and has not seen a dentist due to lack of copay.        Past Medical History:  Diagnosis Date   Back pain    Degenerative disc disease    Hypertension     Past Surgical History:  Procedure Laterality Date   ANKLE FUSION     KNEE ARTHROSCOPY      Family History  Problem Relation Age of Onset   Hypertension Mother    Diabetes Maternal Aunt    Cancer Maternal Aunt    Cancer Maternal Grandmother     Social History   Socioeconomic History   Marital status: Married    Spouse name: Not on file   Number of children: Not on file   Years of education: Not on file   Highest education level: Not on file  Occupational History   Not on file  Tobacco Use   Smoking status: Every Day    Current packs/day: 1.00    Types: Cigarettes   Smokeless tobacco: Never  Substance and Sexual Activity   Alcohol use: Yes    Alcohol/week: 24.0  standard drinks of alcohol    Types: 24 Cans of beer per week   Drug use: No   Sexual activity: Not on file  Other Topics Concern   Not on file  Social History Narrative   Not on file   Social Determinants of Health   Financial Resource Strain: Not on file  Food Insecurity: Not on file  Transportation Needs: Not on file  Physical Activity: Not on file  Stress: Not on file  Social Connections: Not on file    No Known Allergies  Outpatient Medications Prior to Visit  Medication Sig Dispense Refill   Cholecalciferol (VITAMIN D) 50 MCG (2000 UT) CAPS Take 1 capsule (2,000 Units total) by mouth daily. 30 capsule    methocarbamol (ROBAXIN) 750 MG tablet Take 1 tablet (750 mg total) by mouth every 8 (eight) hours as needed for muscle spasms. 90 tablet 1   amLODipine (NORVASC) 5 MG tablet Take 1 tablet (5 mg total) by mouth daily. 30 tablet 6   atorvastatin (LIPITOR) 20 MG tablet Take 1 tablet (20 mg total) by mouth daily. 30 tablet 6   lidocaine (LIDODERM) 5 % Place 1 patch onto the skin daily. Remove & Discard patch within 12 hours or as directed by MD (Patient not taking: Reported on 03/11/2023) 30 patch  0   meloxicam (MOBIC) 7.5 MG tablet Take 1 tablet (7.5 mg total) by mouth daily. (Patient not taking: Reported on 03/11/2023) 30 tablet 2   Multiple Vitamin (MULTIVITAMIN WITH MINERALS) TABS tablet Take 1 tablet by mouth daily. (Patient not taking: Reported on 03/11/2023)     nicotine (NICODERM CQ - DOSED IN MG/24 HOURS) 21 mg/24hr patch Place 1 patch (21 mg total) onto the skin daily as needed (Craving cigarettes). (Patient not taking: Reported on 03/11/2023) 28 patch 0   thiamine 100 MG tablet Take 1 tablet (100 mg total) by mouth daily. (Patient not taking: Reported on 03/11/2023) 30 tablet 1   DULoxetine (CYMBALTA) 60 MG capsule Take 1 capsule (60 mg total) by mouth daily. For chronic pain (Patient not taking: Reported on 03/11/2023) 30 capsule 3   No facility-administered  medications prior to visit.     ROS Review of Systems  Constitutional:  Negative for activity change and appetite change.  HENT:  Positive for dental problem. Negative for sinus pressure and sore throat.   Respiratory:  Negative for chest tightness, shortness of breath and wheezing.   Cardiovascular:  Negative for chest pain and palpitations.  Gastrointestinal:  Negative for abdominal distention, abdominal pain and constipation.  Genitourinary: Negative.   Musculoskeletal: Negative.   Psychiatric/Behavioral:  Negative for behavioral problems and dysphoric mood.     Objective:  BP 134/74   Pulse 85   Ht 5\' 11"  (1.803 m)   Wt 159 lb 3.2 oz (72.2 kg)   SpO2 99%   BMI 22.20 kg/m      03/11/2023   11:06 AM 09/08/2022   10:24 AM 03/10/2022   10:36 AM  BP/Weight  Systolic BP 134 134 126  Diastolic BP 74 78 74  Wt. (Lbs) 159.2 158 168  BMI 22.2 kg/m2 22.04 kg/m2 23.43 kg/m2      Physical Exam Constitutional:      Appearance: He is well-developed.  HENT:     Mouth/Throat:     Comments: Poor dentition Cardiovascular:     Rate and Rhythm: Normal rate.     Heart sounds: Normal heart sounds. No murmur heard. Pulmonary:     Effort: Pulmonary effort is normal.     Breath sounds: Normal breath sounds. No wheezing or rales.  Chest:     Chest wall: No tenderness.  Abdominal:     General: Bowel sounds are normal. There is no distension.     Palpations: Abdomen is soft. There is no mass.     Tenderness: There is no abdominal tenderness.  Musculoskeletal:        General: Normal range of motion.     Right lower leg: No edema.     Left lower leg: No edema.  Neurological:     Mental Status: He is alert and oriented to person, place, and time.  Psychiatric:        Mood and Affect: Mood normal.        Latest Ref Rng & Units 09/08/2022    1:41 PM 03/10/2022   11:20 AM 11/02/2021    5:42 AM  CMP  Glucose 70 - 99 mg/dL 621  82  308   BUN 6 - 24 mg/dL 7  12  6    Creatinine  0.76 - 1.27 mg/dL 6.57  8.46  9.62   Sodium 134 - 144 mmol/L 138  138  139   Potassium 3.5 - 5.2 mmol/L 4.2  4.4  3.8   Chloride 96 - 106 mmol/L  100  97  104   CO2 20 - 29 mmol/L 23  23  29    Calcium 8.7 - 10.2 mg/dL 9.2  9.6  8.7   Total Protein 6.0 - 8.5 g/dL 7.2  7.9    Total Bilirubin 0.0 - 1.2 mg/dL 0.3  0.5    Alkaline Phos 44 - 121 IU/L 99  91    AST 0 - 40 IU/L 55  30    ALT 0 - 44 IU/L 56  48      Lipid Panel     Component Value Date/Time   CHOL 132 09/08/2022 1341   TRIG 100 09/08/2022 1341   HDL 90 09/08/2022 1341   CHOLHDL 2.1 07/18/2020 1408   CHOLHDL 2.2 03/14/2013 0935   VLDL 20 03/14/2013 0935   LDLCALC 24 09/08/2022 1341    CBC    Component Value Date/Time   WBC 4.4 09/08/2022 1341   WBC 8.1 11/01/2021 0254   RBC 4.79 09/08/2022 1341   RBC 4.40 11/01/2021 0254   HGB 14.8 09/08/2022 1341   HCT 44.1 09/08/2022 1341   PLT 180 09/08/2022 1341   MCV 92 09/08/2022 1341   MCH 30.9 09/08/2022 1341   MCH 31.6 11/01/2021 0254   MCHC 33.6 09/08/2022 1341   MCHC 33.7 11/01/2021 0254   RDW 13.6 09/08/2022 1341   LYMPHSABS 1.5 09/08/2022 1341   MONOABS 0.5 08/21/2021 1937   EOSABS 0.1 09/08/2022 1341   BASOSABS 0.0 09/08/2022 1341    Lab Results  Component Value Date   HGBA1C 5.8 (A) 12/02/2021    Assessment & Plan:      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 History of alcohol use and smoking, both risk factors for stroke. -Risk factor modification including blood pressure control and cholesterol control. -Continue statin, aspirin  Degenerative Disc Disease Reports back pain. Currently on Duloxetine 60mg , Robaxin, Mobic for pain management. -Refill Duloxetine 60mg .  Hypertension -Controlled -Continue amlodipine -Counseled on blood pressure goal of less than 130/80, low-sodium, DASH diet, medication compliance, 150 minutes of moderate intensity exercise per  week. Discussed medication compliance, adverse effects.    Dental Pain Reports dental pain but has not seen a dentist due to copay issues. -Place referral for dental evaluation.  Tobacco Use Reports smoking approximately 2 packs per week for over 40 years. Discussed the risks of continued smoking, particularly increased risk of stroke. -Encourage smoking cessation.  General Health Maintenance -Order blood work to check kidney and liver function. -Schedule follow-up visit in 6 months.          Meds ordered this encounter  Medications   amLODipine (NORVASC) 5 MG tablet    Sig: Take 1 tablet (5 mg total) by mouth daily.    Dispense:  90 tablet    Refill:  1   atorvastatin (LIPITOR) 20 MG tablet    Sig: Take 1 tablet (20 mg total) by mouth daily.    Dispense:  90 tablet    Refill:  1   DULoxetine (CYMBALTA) 60 MG capsule    Sig: Take 1 capsule (60 mg total) by mouth daily. For chronic pain    Dispense:  90 capsule    Refill:  1    Follow-up: Return in about 6 months (around 09/08/2023) for Chronic medical conditions.       Hoy Register, MD, FAAFP. [facility] [place], [place] [phone]   03/11/2023,  11:45 AM

## 2023-03-11 NOTE — Patient Instructions (Signed)
VISIT SUMMARY:  During today's visit, we reviewed your current medications and discussed your health concerns, including your history of stroke, back pain from degenerative disc disease, dental pain, and tobacco use. We also talked about your recent hospital admission and your lifestyle habits, such as alcohol consumption and smoking.  YOUR PLAN:  -OLD RIGHT MIDDLE CEREBRAL ARTERY (RMCA) STROKE: This refers to a previous stroke affecting the right middle cerebral artery in your brain. There are no current neurological issues noted. Continue taking your current medications, Amlodipine for blood pressure and Atorvastatin for cholesterol.  -DEGENERATIVE DISC DISEASE: This condition involves the breakdown of the discs in your spine, causing back pain. Continue taking Duloxetine 60mg  for pain management, and we have refilled your prescription.  -DENTAL PAIN: You reported tooth pain but have not seen a dentist due to copay issues. We will place a referral for a dental evaluation to address this.  -TOBACCO USE: You have been smoking for over 40 years, which increases your risk of stroke and other health issues. We discussed the importance of quitting smoking and encourage you to consider cessation options.  -GENERAL HEALTH MAINTENANCE: We will order blood work to check your kidney and liver function. Please schedule a follow-up visit in 6 months to review your health status and lab results.  INSTRUCTIONS:  Please schedule a follow-up visit in 6 months. Additionally, we will order blood work to check your kidney and liver function. Make sure to follow up with the dental referral to address your tooth pain.

## 2023-03-12 LAB — CMP14+EGFR
ALT: 82 [IU]/L — ABNORMAL HIGH (ref 0–44)
AST: 58 [IU]/L — ABNORMAL HIGH (ref 0–40)
Albumin: 4.4 g/dL (ref 3.8–4.9)
Alkaline Phosphatase: 118 [IU]/L (ref 44–121)
BUN/Creatinine Ratio: 8 — ABNORMAL LOW (ref 9–20)
BUN: 8 mg/dL (ref 6–24)
Bilirubin Total: 0.4 mg/dL (ref 0.0–1.2)
CO2: 19 mmol/L — ABNORMAL LOW (ref 20–29)
Calcium: 9.2 mg/dL (ref 8.7–10.2)
Chloride: 101 mmol/L (ref 96–106)
Creatinine, Ser: 0.95 mg/dL (ref 0.76–1.27)
Globulin, Total: 2.8 g/dL (ref 1.5–4.5)
Glucose: 92 mg/dL (ref 70–99)
Potassium: 4.9 mmol/L (ref 3.5–5.2)
Sodium: 141 mmol/L (ref 134–144)
Total Protein: 7.2 g/dL (ref 6.0–8.5)
eGFR: 94 mL/min/{1.73_m2} (ref >59)

## 2023-03-16 ENCOUNTER — Other Ambulatory Visit: Payer: Self-pay | Admitting: Family Medicine

## 2023-03-16 DIAGNOSIS — M51369 Other intervertebral disc degeneration, lumbar region without mention of lumbar back pain or lower extremity pain: Secondary | ICD-10-CM

## 2023-05-03 ENCOUNTER — Encounter: Payer: Self-pay | Admitting: Family Medicine

## 2023-05-03 ENCOUNTER — Ambulatory Visit: Payer: Self-pay | Attending: Family Medicine | Admitting: Family Medicine

## 2023-05-03 DIAGNOSIS — M5136 Other intervertebral disc degeneration, lumbar region with discogenic back pain only: Secondary | ICD-10-CM

## 2023-05-03 NOTE — Progress Notes (Signed)
 Virtual Visit via Telephone Note  I connected with Brady Kim, on 05/03/2023 at 1:33 PM by telephone and verified that I am speaking with the correct person using two identifiers.   Consent: I discussed the limitations, risks, security and privacy concerns of performing an evaluation and management service by telephone and the availability of in person appointments. I also discussed with the patient that there may be a patient responsible charge related to this service. The patient expressed understanding and agreed to proceed.   Location of Patient: Home  Location of Provider: Clinic   Persons participating in Telemedicine visit: Brady Kim Dr. Delbert     History of Present Illness: Brady Kim is a 58 y.o. year old male with  a history of nicotine  dependence (10 cigarettes/day x 40 years), alcohol dependence, seizures, degenerative disc disease of lumbar spine, MCA infarct (from CT head 10/2021). Discussed the use of AI scribe software for clinical note transcription with the patient, who gave verbal consent to proceed.  He presents for completion of a disability form. He reports a history of chronic low back pain and a stroke last year. His back pain, which he rates as 8/10, is his primary disability. He experiences this pain daily, particularly in the mornings, and it often takes him a while to get started with his day due to the severity of the pain. He reports that he can sit for about 10 minutes and stand for about 20 minutes before needing to take a break due to pain. His ability to walk varies, but he estimates that he can walk about a block before needing to rest. He also reports needing to elevate his legs during a typical work day. He has no reported side effects from his current medications. He estimates that he can lift less than 25 pounds and reports difficulty with kneeling. His back pain has been a problem for over ten years.         Past Medical History:   Diagnosis Date   Back pain    Degenerative disc disease    Hypertension    No Known Allergies  Current Outpatient Medications on File Prior to Visit  Medication Sig Dispense Refill   amLODipine  (NORVASC ) 5 MG tablet Take 1 tablet (5 mg total) by mouth daily. 90 tablet 1   atorvastatin  (LIPITOR) 20 MG tablet Take 1 tablet (20 mg total) by mouth daily. 90 tablet 1   Cholecalciferol  (VITAMIN D ) 50 MCG (2000 UT) CAPS Take 1 capsule (2,000 Units total) by mouth daily. 30 capsule    DULoxetine  (CYMBALTA ) 60 MG capsule Take 1 capsule (60 mg total) by mouth daily. For chronic pain 90 capsule 1   lidocaine  (LIDODERM ) 5 % Place 1 patch onto the skin daily. Remove & Discard patch within 12 hours or as directed by MD (Patient not taking: Reported on 03/11/2023) 30 patch 0   meloxicam  (MOBIC ) 7.5 MG tablet TAKE 1 TABLET BY MOUTH EVERY DAY 30 tablet 2   methocarbamol  (ROBAXIN ) 750 MG tablet Take 1 tablet (750 mg total) by mouth every 8 (eight) hours as needed for muscle spasms. 90 tablet 1   Multiple Vitamin (MULTIVITAMIN WITH MINERALS) TABS tablet Take 1 tablet by mouth daily. (Patient not taking: Reported on 03/11/2023)     nicotine  (NICODERM CQ  - DOSED IN MG/24 HOURS) 21 mg/24hr patch Place 1 patch (21 mg total) onto the skin daily as needed (Craving cigarettes). (Patient not taking: Reported on 03/11/2023) 28 patch 0   thiamine   100 MG tablet Take 1 tablet (100 mg total) by mouth daily. (Patient not taking: Reported on 03/11/2023) 30 tablet 1   No current facility-administered medications on file prior to visit.    ROS: See HPI  Observations/Objective: Awake, alert, oriented x3 Not in acute distress Normal mood      Latest Ref Rng & Units 03/11/2023   11:45 AM 09/08/2022    1:41 PM 03/10/2022   11:20 AM  CMP  Glucose 70 - 99 mg/dL 92  896  82   BUN 6 - 24 mg/dL 8  7  12    Creatinine 0.76 - 1.27 mg/dL 9.04  9.19  8.96   Sodium 134 - 144 mmol/L 141  138  138   Potassium 3.5 - 5.2 mmol/L  4.9  4.2  4.4   Chloride 96 - 106 mmol/L 101  100  97   CO2 20 - 29 mmol/L 19  23  23    Calcium  8.7 - 10.2 mg/dL 9.2  9.2  9.6   Total Protein 6.0 - 8.5 g/dL 7.2  7.2  7.9   Total Bilirubin 0.0 - 1.2 mg/dL 0.4  0.3  0.5   Alkaline Phos 44 - 121 IU/L 118  99  91   AST 0 - 40 IU/L 58  55  30   ALT 0 - 44 IU/L 82  56  48     Lipid Panel     Component Value Date/Time   CHOL 132 09/08/2022 1341   TRIG 100 09/08/2022 1341   HDL 90 09/08/2022 1341   CHOLHDL 2.1 07/18/2020 1408   CHOLHDL 2.2 03/14/2013 0935   VLDL 20 03/14/2013 0935   LDLCALC 24 09/08/2022 1341   LABVLDL 18 09/08/2022 1341    Lab Results  Component Value Date   HGBA1C 5.8 (A) 12/02/2021    Assessment and Plan:     Chronic Low Back Pain Severe pain reported daily, particularly in the morning. Pain is debilitating and impacts daily function. Difficulty with sitting (10 minutes), standing (20 minutes), and walking (1 block). Difficulty with kneeling and lifting (less than 25 pounds). History of back problem for over 10 years. -Complete and submit disability form based on patient's reported symptoms and limitations.  History of Stroke No current symptoms reported. -No changes to current management plan.  No mental health concerns reported. -No changes to current management plan.        Follow Up Instructions: Keep previously scheduled appointment   I discussed the assessment and treatment plan with the patient. The patient was provided an opportunity to ask questions and all were answered. The patient agreed with the plan and demonstrated an understanding of the instructions.   The patient was advised to call back or seek an in-person evaluation if the symptoms worsen or if the condition fails to improve as anticipated.     I provided 11 minutes total of non-face-to-face time during this encounter.   Corrina Sabin, MD, FAAFP. [facility] [place],  [place] [phone]   05/03/2023, 1:33 PM

## 2023-06-29 ENCOUNTER — Encounter (HOSPITAL_COMMUNITY): Payer: Self-pay

## 2023-06-29 ENCOUNTER — Other Ambulatory Visit: Payer: Self-pay

## 2023-06-29 ENCOUNTER — Emergency Department (HOSPITAL_COMMUNITY)
Admission: EM | Admit: 2023-06-29 | Discharge: 2023-06-29 | Disposition: A | Discharge status: Home / Self Care | Payer: Self-pay | Attending: Emergency Medicine | Admitting: Emergency Medicine

## 2023-06-29 DIAGNOSIS — Y92410 Unspecified street and highway as the place of occurrence of the external cause: Secondary | ICD-10-CM | POA: Insufficient documentation

## 2023-06-29 DIAGNOSIS — M542 Cervicalgia: Secondary | ICD-10-CM | POA: Diagnosis not present

## 2023-06-29 DIAGNOSIS — M545 Low back pain, unspecified: Secondary | ICD-10-CM | POA: Diagnosis not present
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
## 2023-07-08 ENCOUNTER — Other Ambulatory Visit: Payer: Self-pay | Admitting: Family Medicine

## 2023-07-08 DIAGNOSIS — I10 Essential (primary) hypertension: Secondary | ICD-10-CM

## 2023-07-16 DIAGNOSIS — Z1388 Encounter for screening for disorder due to exposure to contaminants: Secondary | ICD-10-CM | POA: Diagnosis not present

## 2023-07-16 DIAGNOSIS — Z0389 Encounter for observation for other suspected diseases and conditions ruled out: Secondary | ICD-10-CM | POA: Diagnosis not present

## 2023-07-16 DIAGNOSIS — Z3009 Encounter for other general counseling and advice on contraception: Secondary | ICD-10-CM | POA: Diagnosis not present

## 2023-08-24 ENCOUNTER — Encounter (INDEPENDENT_AMBULATORY_CARE_PROVIDER_SITE_OTHER): Payer: Self-pay | Admitting: Primary Care

## 2023-08-24 ENCOUNTER — Ambulatory Visit (INDEPENDENT_AMBULATORY_CARE_PROVIDER_SITE_OTHER): Admitting: Primary Care

## 2023-08-24 VITALS — BP 131/92 | HR 90 | Resp 16 | Ht 71.0 in | Wt 147.0 lb

## 2023-08-24 DIAGNOSIS — Z9189 Other specified personal risk factors, not elsewhere classified: Secondary | ICD-10-CM | POA: Diagnosis not present

## 2023-08-24 NOTE — Progress Notes (Signed)
 [facility]  Brady Kim, is a 58 y.o. male  ZOX:[id number]  [id number]  DOB - 11-14-1965 Cc : LOOSE TEETH NEEDING A DENTIST       Subjective:   Brady Kim is a 58 y.o. male here today for an acute visit.  Patient presents today for dental pain and the front middle tooth is loose and last already to left in the front of his mouth and he has one more tooth on the right back upper.  He is having difficulty chewing, sensitivity to hot and cold changes also pain.  Pain level is 9/10.  Previously referred to the dentist  by PCP referral placed however due to co-pay patient was not seen.  No problems updated.  Comprehensive Kim Pertinent positive and negative noted in HPI   No Known Allergies  Past Medical History:  Diagnosis Date   Back pain    Degenerative disc disease    Hypertension     Current Outpatient Medications on File Prior to Visit  Medication Sig Dispense Refill   amLODipine  (NORVASC ) 5 MG tablet TAKE 1 TABLET (5 MG TOTAL) BY MOUTH DAILY. 90 tablet 0   atorvastatin  (LIPITOR) 20 MG tablet Take 1 tablet (20 mg total) by mouth daily. 90 tablet 1   Cholecalciferol  (VITAMIN D ) 50 MCG (2000 UT) CAPS Take 1 capsule (2,000 Units total) by mouth daily. 30 capsule    DULoxetine  (CYMBALTA ) 60 MG capsule Take 1 capsule (60 mg total) by mouth daily. For chronic pain 90 capsule 1   lidocaine  (LIDODERM ) 5 % Place 1 patch onto the skin daily. Remove & Discard patch within 12 hours or as directed by MD (Patient not taking: Reported on 03/11/2023) 30 patch 0   meloxicam  (MOBIC ) 7.5 MG tablet TAKE 1 TABLET BY MOUTH EVERY DAY 30 tablet 2   methocarbamol  (ROBAXIN ) 750 MG tablet Take 1 tablet (750 mg total) by mouth every 8 (eight) hours as needed for muscle spasms. 90 tablet 1   Multiple Vitamin (MULTIVITAMIN WITH MINERALS) TABS tablet Take 1 tablet by mouth daily. (Patient not taking: Reported on 03/11/2023)     nicotine  (NICODERM CQ  - DOSED IN MG/24 HOURS) 21 mg/24hr  patch Place 1 patch (21 mg total) onto the skin daily as needed (Craving cigarettes). (Patient not taking: Reported on 03/11/2023) 28 patch 0   thiamine  100 MG tablet Take 1 tablet (100 mg total) by mouth daily. (Patient not taking: Reported on 03/11/2023) 30 tablet 1   No current facility-administered medications on file prior to visit.   Health Maintenance  Topic Date Due   Complete foot exam   Never done   Eye exam for diabetics  Never done   Yearly kidney health urinalysis for diabetes  Never done   Pneumococcal Vaccination (1 of 2 - PCV) Never done   Colon Cancer Screening  Never done   Screening for Lung Cancer  Never done   Zoster (Shingles) Vaccine (1 of 2) Never done   Hemoglobin A1C  06/04/2022   COVID-19 Vaccine (1 - 2024-25 season) Never done   Flu Shot  11/26/2023   Yearly kidney function blood test for diabetes  03/10/2024   DTaP/Tdap/Td vaccine (3 - Td or Tdap) 03/23/2025   Hepatitis C Screening  Completed   HIV Screening  Completed   HPV Vaccine  Aged Out   Meningitis B Vaccine  Aged Out    Objective:  BP (!) 131/92 (Cuff Size: Normal)   Pulse 90   Ht 5\' 11"  (  1.803 m)   Wt 147 lb (66.7 kg)   BMI 20.50 kg/m   There were no vitals filed for this visit.   Physical Exam Vitals reviewed.  Constitutional:      Appearance: Normal appearance.  HENT:     Head: Normocephalic.     Right Ear: External ear normal.     Left Ear: External ear normal.     Mouth/Throat:     Comments: Poor dedentition  Eyes:     Extraocular Movements: Extraocular movements intact.  Cardiovascular:     Rate and Rhythm: Normal rate and regular rhythm.  Pulmonary:     Effort: Pulmonary effort is normal.     Breath sounds: Normal breath sounds.  Abdominal:     General: Bowel sounds are normal.     Palpations: Abdomen is soft.  Musculoskeletal:        General: Normal range of motion.     Cervical back: Normal range of motion.  Skin:    General: Skin is warm and dry.  Neurological:      Mental Status: He is oriented to person, place, and time.  Psychiatric:        Mood and Affect: Mood normal.        Behavior: Behavior normal.     Assessment & Plan  Diagnoses and all orders for this visit:  Poor dental hygiene  -     Ambulatory referral to Dentistry Spoke with referral coordinator provided information on AVS for patient to try to schedule an appointment with and a referral will still be placed on his behalf for a dentist.  Patient is aware that not a lot of dentists take his insurance.    Patient have been counseled extensively about nutrition and exercise. Other issues discussed during this visit include: low cholesterol diet, weight control and daily exercise, foot care, annual eye examinations at Ophthalmology, importance of adherence with medications and regular follow-up. We also discussed long term complications of uncontrolled diabetes and hypertension.   No follow-ups on file.  The patient was given clear instructions to go to ER or return to medical center if symptoms don't improve, worsen or new problems develop. The patient verbalized understanding. The patient was told to call to get lab results if they haven't heard anything in the next week.   This note has been created with Education officer, environmental. Any transcriptional errors are unintentional.   Marius Siemens, NP 08/24/2023, 10:03 AM

## 2023-08-24 NOTE — Patient Instructions (Signed)
 Dr. Bonnielee Buttery [phone] DENTIST [id number]

## 2023-09-08 ENCOUNTER — Ambulatory Visit: Payer: Self-pay | Attending: Family Medicine | Admitting: Family Medicine

## 2023-09-08 ENCOUNTER — Encounter: Payer: Self-pay | Admitting: Family Medicine

## 2023-09-08 VITALS — BP 129/78 | HR 83 | Ht 71.0 in | Wt 151.2 lb

## 2023-09-08 DIAGNOSIS — I10 Essential (primary) hypertension: Secondary | ICD-10-CM

## 2023-09-08 DIAGNOSIS — F17210 Nicotine dependence, cigarettes, uncomplicated: Secondary | ICD-10-CM

## 2023-09-08 DIAGNOSIS — M545 Low back pain, unspecified: Secondary | ICD-10-CM

## 2023-09-08 DIAGNOSIS — R7303 Prediabetes: Secondary | ICD-10-CM | POA: Diagnosis not present

## 2023-09-08 DIAGNOSIS — R7989 Other specified abnormal findings of blood chemistry: Secondary | ICD-10-CM | POA: Diagnosis not present

## 2023-09-08 DIAGNOSIS — F1020 Alcohol dependence, uncomplicated: Secondary | ICD-10-CM

## 2023-09-08 DIAGNOSIS — M5136 Other intervertebral disc degeneration, lumbar region with discogenic back pain only: Secondary | ICD-10-CM

## 2023-09-08 DIAGNOSIS — Z23 Encounter for immunization: Secondary | ICD-10-CM | POA: Diagnosis not present

## 2023-09-08 DIAGNOSIS — Z8673 Personal history of transient ischemic attack (TIA), and cerebral infarction without residual deficits: Secondary | ICD-10-CM

## 2023-09-08 DIAGNOSIS — Z131 Encounter for screening for diabetes mellitus: Secondary | ICD-10-CM

## 2023-09-08 DIAGNOSIS — G8929 Other chronic pain: Secondary | ICD-10-CM

## 2023-09-08 DIAGNOSIS — H5213 Myopia, bilateral: Secondary | ICD-10-CM | POA: Diagnosis not present

## 2023-09-08 LAB — POCT GLYCOSYLATED HEMOGLOBIN (HGB A1C): HbA1c, POC (controlled diabetic range): 5.4 % (ref 0.0–7.0)

## 2023-09-08 MED ORDER — BUPROPION HCL ER (XL) 150 MG PO TB24: 150.0000 mg | ORAL_TABLET | Freq: Every day | ORAL | 1 refills | Status: AC

## 2023-09-08 MED ORDER — AMLODIPINE BESYLATE 5 MG PO TABS: 5.0000 mg | ORAL_TABLET | Freq: Every day | ORAL | 1 refills | Status: DC

## 2023-09-08 MED ORDER — ATORVASTATIN CALCIUM 20 MG PO TABS: 20.0000 mg | ORAL_TABLET | Freq: Every day | ORAL | 1 refills | Status: DC

## 2023-09-08 MED ORDER — DULOXETINE HCL 60 MG PO CPEP: 60.0000 mg | ORAL_CAPSULE | Freq: Every day | ORAL | 1 refills | Status: AC

## 2023-09-08 MED ORDER — MELOXICAM 7.5 MG PO TABS: 7.5000 mg | ORAL_TABLET | Freq: Every day | ORAL | 2 refills | Status: AC

## 2023-09-08 NOTE — Progress Notes (Signed)
 Subjective:  Patient ID: Brady Kim, male    DOB: 11-17-1965  Age: 58 y.o. MRN: [id number]  CC: Medical Management of Chronic Issues (No concerns)     Discussed the use of AI scribe software for clinical note transcription with the patient, who gave verbal consent to proceed.  History of Present Illness Brady Kim is a 58 year old male with  a history of nicotine  dependence (10 cigarettes/day x 40 years), alcohol dependence, seizures, diabetes degenerative disc disease of lumbar spine, MCA infarct (from CT head 10/2021). who presents for a follow-up visit.  He has prediabetes with previous A1c values between 5.8-6.1, now at 5.4. Current medications include meloxicam , duloxetine , amlodipine , and atorvastatin . Blood work revealed elevated LFTs at last visit, he endorses consumption of alcohol.  Cholesterol levels were normal in May.  Chronic lower back pain is managed with meloxicam  and duloxetine . He reports no pain today and no pain while walking.  He smokes one pack of cigarettes every two to three days for about forty years. He has not undergone lung cancer screening.     Past Medical History:  Diagnosis Date   Back pain    Degenerative disc disease    Hypertension     Past Surgical History:  Procedure Laterality Date   ANKLE FUSION     KNEE ARTHROSCOPY      Family History  Problem Relation Age of Onset   Hypertension Mother    Diabetes Maternal Aunt    Cancer Maternal Aunt    Cancer Maternal Grandmother     Social History   Socioeconomic History   Marital status: Married    Spouse name: Not on file   Number of children: Not on file   Years of education: Not on file   Highest education level: 10th grade  Occupational History   Not on file  Tobacco Use   Smoking status: Every Day    Current packs/day: 1.00    Types: Cigarettes   Smokeless tobacco: Never  Vaping Use   Vaping status: Never Used  Substance and Sexual Activity   Alcohol use: Yes     Alcohol/week: 24.0 standard drinks of alcohol    Types: 24 Cans of beer per week   Drug use: No   Sexual activity: Not on file  Other Topics Concern   Not on file  Social History Narrative   Not on file   Social Drivers of Health   Financial Resource Strain: Low Risk  (09/08/2023)   Overall Financial Resource Strain (CARDIA)    Difficulty of Paying Living Expenses: Not hard at all  Recent Concern: Financial Resource Strain - Medium Risk (09/07/2023)   Overall Financial Resource Strain (CARDIA)    Difficulty of Paying Living Expenses: Somewhat hard  Food Insecurity: No Food Insecurity (09/08/2023)   Hunger Vital Sign    Worried About Running Out of Food in the Last Year: Never true    Ran Out of Food in the Last Year: Never true  Recent Concern: Food Insecurity - Food Insecurity Present (09/07/2023)   Hunger Vital Sign    Worried About Running Out of Food in the Last Year: Sometimes true    Ran Out of Food in the Last Year: Sometimes true  Transportation Needs: No Transportation Needs (09/08/2023)   PRAPARE - Administrator, Civil Service (Medical): No    Lack of Transportation (Non-Medical): No  Physical Activity: Inactive (09/07/2023)   Exercise Vital Sign    Days of Exercise  per Week: 1 day    Minutes of Exercise per Session: 0 min  Stress: No Stress Concern Present (09/07/2023)   [employer] of Occupational Health - Occupational Stress Questionnaire    Feeling of Stress : Only a little  Social Connections: Moderately Integrated (09/08/2023)   Social Connection and Isolation Panel [NHANES]    Frequency of Communication with Friends and Family: More than three times a week    Frequency of Social Gatherings with Friends and Family: Never    Attends Religious Services: 1 to 4 times per year    Active Member of [employer] or Organizations: Yes    Attends Banker Meetings: Never    Marital Status: Separated    No Known Allergies  Outpatient Medications  Prior to Visit  Medication Sig Dispense Refill   Cholecalciferol  (VITAMIN D ) 50 MCG (2000 UT) CAPS Take 1 capsule (2,000 Units total) by mouth daily. 30 capsule    lidocaine  (LIDODERM ) 5 % Place 1 patch onto the skin daily. Remove & Discard patch within 12 hours or as directed by MD 30 patch 0   methocarbamol  (ROBAXIN ) 750 MG tablet Take 1 tablet (750 mg total) by mouth every 8 (eight) hours as needed for muscle spasms. 90 tablet 1   Multiple Vitamin (MULTIVITAMIN WITH MINERALS) TABS tablet Take 1 tablet by mouth daily.     thiamine  100 MG tablet Take 1 tablet (100 mg total) by mouth daily. 30 tablet 1   amLODipine  (NORVASC ) 5 MG tablet TAKE 1 TABLET (5 MG TOTAL) BY MOUTH DAILY. 90 tablet 0   atorvastatin  (LIPITOR) 20 MG tablet Take 1 tablet (20 mg total) by mouth daily. 90 tablet 1   DULoxetine  (CYMBALTA ) 60 MG capsule Take 1 capsule (60 mg total) by mouth daily. For chronic pain 90 capsule 1   meloxicam  (MOBIC ) 7.5 MG tablet TAKE 1 TABLET BY MOUTH EVERY DAY 30 tablet 2   nicotine  (NICODERM CQ  - DOSED IN MG/24 HOURS) 21 mg/24hr patch Place 1 patch (21 mg total) onto the skin daily as needed (Craving cigarettes). (Patient not taking: Reported on 09/08/2023) 28 patch 0   No facility-administered medications prior to visit.     ROS Review of Systems  Constitutional:  Negative for activity change and appetite change.  HENT:  Negative for sinus pressure and sore throat.   Respiratory:  Negative for chest tightness, shortness of breath and wheezing.   Cardiovascular:  Negative for chest pain and palpitations.  Gastrointestinal:  Negative for abdominal distention, abdominal pain and constipation.  Genitourinary: Negative.   Musculoskeletal: Negative.   Psychiatric/Behavioral:  Negative for behavioral problems and dysphoric mood.     Objective:  BP 129/78   Pulse 83   Ht 5\' 11"  (1.803 m)   Wt 151 lb 3.2 oz (68.6 kg)   SpO2 99%   BMI 21.09 kg/m      09/08/2023   10:48 AM 08/24/2023    10:29 AM 06/29/2023    3:00 PM  BP/Weight  Systolic BP 129 131 132  Diastolic BP 78 92 84  Wt. (Lbs) 151.2 147   BMI 21.09 kg/m2 20.5 kg/m2       Physical Exam Constitutional:      Appearance: He is well-developed.  Cardiovascular:     Rate and Rhythm: Normal rate.     Heart sounds: Normal heart sounds. No murmur heard. Pulmonary:     Effort: Pulmonary effort is normal.     Breath sounds: Normal breath sounds. No wheezing or  rales.  Chest:     Chest wall: No tenderness.  Abdominal:     General: Bowel sounds are normal. There is no distension.     Palpations: Abdomen is soft. There is no mass.     Tenderness: There is no abdominal tenderness.  Musculoskeletal:        General: Normal range of motion.     Right lower leg: No edema.     Left lower leg: No edema.  Neurological:     Mental Status: He is alert and oriented to person, place, and time.  Psychiatric:        Mood and Affect: Mood normal.        Latest Ref Rng & Units 03/11/2023   11:45 AM 09/08/2022    1:41 PM 03/10/2022   11:20 AM  CMP  Glucose 70 - 99 mg/dL 92  161  82   BUN 6 - 24 mg/dL 8  7  12    Creatinine 0.76 - 1.27 mg/dL 0.96  0.45  4.09   Sodium 134 - 144 mmol/L 141  138  138   Potassium 3.5 - 5.2 mmol/L 4.9  4.2  4.4   Chloride 96 - 106 mmol/L 101  100  97   CO2 20 - 29 mmol/L 19  23  23    Calcium  8.7 - 10.2 mg/dL 9.2  9.2  9.6   Total Protein 6.0 - 8.5 g/dL 7.2  7.2  7.9   Total Bilirubin 0.0 - 1.2 mg/dL 0.4  0.3  0.5   Alkaline Phos 44 - 121 IU/L 118  99  91   AST 0 - 40 IU/L 58  55  30   ALT 0 - 44 IU/L 82  56  48     Lipid Panel     Component Value Date/Time   CHOL 132 09/08/2022 1341   TRIG 100 09/08/2022 1341   HDL 90 09/08/2022 1341   CHOLHDL 2.1 07/18/2020 1408   CHOLHDL 2.2 03/14/2013 0935   VLDL 20 03/14/2013 0935   LDLCALC 24 09/08/2022 1341    CBC    Component Value Date/Time   WBC 4.4 09/08/2022 1341   WBC 8.1 11/01/2021 0254   RBC 4.79 09/08/2022 1341   RBC 4.40  11/01/2021 0254   HGB 14.8 09/08/2022 1341   HCT 44.1 09/08/2022 1341   PLT 180 09/08/2022 1341   MCV 92 09/08/2022 1341   MCH 30.9 09/08/2022 1341   MCH 31.6 11/01/2021 0254   MCHC 33.6 09/08/2022 1341   MCHC 33.7 11/01/2021 0254   RDW 13.6 09/08/2022 1341   LYMPHSABS 1.5 09/08/2022 1341   MONOABS 0.5 08/21/2021 1937   EOSABS 0.1 09/08/2022 1341   BASOSABS 0.0 09/08/2022 1341    Lab Results  Component Value Date   HGBA1C 5.4 09/08/2023       Assessment & Plan Tobacco use disorder Long-term tobacco use disorder with smoking approximately 10 cigarettes daily for 40 years. He is open to pharmacological assistance for cessation. Previous nicotine  patch attempt was unsuccessful. Emphasized smoking cessation to reduce lung cancer risk and the need for annual CT scans for lung cancer screening. - Prescribe Wellbutrin for smoking cessation. - Order annual CT scan for lung cancer screening.  Elevated liver enzymes Elevated liver enzymes likely related to alcohol consumption. Discussed alcohol's impact on liver health and the importance of reducing intake to prevent further liver damage. - Order blood tests for liver function, kidney function, and cholesterol levels.  Chronic low back  pain Chronic low back pain managed with meloxicam  and duloxetine . No current pain reported. - Continue meloxicam  as needed for pain management. - Continue duloxetine  for pain management.  Prediabetes Prediabetes with previously elevated A1c now normalized to 5.4. Emphasized maintaining a healthy lifestyle to prevent progression to diabetes.  General health maintenance Due for pneumonia vaccination due to increased risk from smoking and alcohol use. - Administer pneumonia vaccine.         Meds ordered this encounter  Medications   amLODipine  (NORVASC ) 5 MG tablet    Sig: Take 1 tablet (5 mg total) by mouth daily.    Dispense:  90 tablet    Refill:  1   atorvastatin  (LIPITOR) 20 MG tablet     Sig: Take 1 tablet (20 mg total) by mouth daily.    Dispense:  90 tablet    Refill:  1   DULoxetine  (CYMBALTA ) 60 MG capsule    Sig: Take 1 capsule (60 mg total) by mouth daily. For chronic pain    Dispense:  90 capsule    Refill:  1   meloxicam  (MOBIC ) 7.5 MG tablet    Sig: Take 1 tablet (7.5 mg total) by mouth daily.    Dispense:  30 tablet    Refill:  2   buPROPion (WELLBUTRIN XL) 150 MG 24 hr tablet    Sig: Take 1 tablet (150 mg total) by mouth daily.    Dispense:  90 tablet    Refill:  1    For smoking cessation    Follow-up: Return in about 6 months (around 03/10/2024) for Chronic medical conditions.       Joaquin Mulberry, MD, FAAFP. [facility] [place], [place] [phone]   09/08/2023, 12:40 PM

## 2023-09-08 NOTE — Patient Instructions (Signed)
 VISIT SUMMARY:  Today, you came in for a follow-up visit. We discussed your prediabetes, chronic lower back pain, tobacco use, and elevated liver enzymes. Your A1c levels have improved, and your blood pressure and cholesterol levels were normal in May. We also talked about the impact of smoking and alcohol on your health.  YOUR PLAN:  -TOBACCO USE DISORDER: You have been smoking about 10 cigarettes daily for 40 years. Smoking increases your risk of lung cancer, so we discussed the importance of quitting. We will start you on Wellbutrin to help you stop smoking, and you will need an annual CT scan to screen for lung cancer.  -ELEVATED LIVER ENZYMES: Your liver enzymes are slightly elevated, likely due to alcohol consumption. Reducing your alcohol intake is important to prevent further liver damage. We will do blood tests to check your liver function, kidney function, and cholesterol levels.  -CHRONIC LOW BACK PAIN: Your chronic low back pain is currently well-managed with meloxicam  and duloxetine , and you reported no pain today. Continue taking meloxicam  as needed and duloxetine  as prescribed.  -PREDIABETES: Your A1c levels, which measure blood sugar, have improved to 5.4. It's important to maintain a healthy lifestyle to prevent developing diabetes.  -GENERAL HEALTH MAINTENANCE: You are due for a pneumonia vaccination because smoking and alcohol use increase your risk of infections. We will administer the pneumonia vaccine today.  INSTRUCTIONS:  1. Start taking Wellbutrin as prescribed to help with smoking cessation. 2. Schedule and complete an annual CT scan for lung cancer screening. 3. Reduce alcohol intake to improve liver health. 4. Continue taking meloxicam  as needed and duloxetine  as prescribed for back pain. 5. Maintain a healthy lifestyle to keep your A1c levels in check. 6. Get the pneumonia vaccine administered today. 7. Complete the ordered blood tests for liver function, kidney  function, and cholesterol levels.

## 2023-09-09 ENCOUNTER — Ambulatory Visit: Payer: Self-pay | Admitting: Family Medicine

## 2023-09-09 LAB — CMP14+EGFR
ALT: 32 IU/L (ref 0–44)
AST: 30 IU/L (ref 0–40)
Albumin: 4.8 g/dL (ref 3.8–4.9)
Alkaline Phosphatase: 99 IU/L (ref 44–121)
BUN/Creatinine Ratio: 12 (ref 9–20)
BUN: 10 mg/dL (ref 6–24)
Bilirubin Total: 0.3 mg/dL (ref 0.0–1.2)
CO2: 23 mmol/L (ref 20–29)
Calcium: 9.1 mg/dL (ref 8.7–10.2)
Chloride: 104 mmol/L (ref 96–106)
Creatinine, Ser: 0.84 mg/dL (ref 0.76–1.27)
Globulin, Total: 2.3 g/dL (ref 1.5–4.5)
Glucose: 86 mg/dL (ref 70–99)
Potassium: 4.7 mmol/L (ref 3.5–5.2)
Sodium: 141 mmol/L (ref 134–144)
Total Protein: 7.1 g/dL (ref 6.0–8.5)
eGFR: 102 mL/min/{1.73_m2} (ref >59)

## 2023-09-09 LAB — LP+NON-HDL CHOLESTEROL
Cholesterol, Total: 146 mg/dL (ref 100–199)
HDL: 89 mg/dL (ref >39)
LDL Chol Calc (NIH): 32 mg/dL (ref 0–99)
Total Non-HDL-Chol (LDL+VLDL): 57 mg/dL (ref 0–129)
Triglycerides: 158 mg/dL — ABNORMAL HIGH (ref 0–149)
VLDL Cholesterol Cal: 25 mg/dL (ref 5–40)

## 2023-09-09 LAB — GAMMA GT: GGT: 233 IU/L — ABNORMAL HIGH (ref 0–65)

## 2023-09-16 ENCOUNTER — Telehealth: Payer: Self-pay | Admitting: Family Medicine

## 2023-09-16 ENCOUNTER — Encounter: Payer: Self-pay | Admitting: Family Medicine

## 2023-09-16 NOTE — Telephone Encounter (Signed)
 Noted

## 2023-09-16 NOTE — Telephone Encounter (Signed)
 FYI Copied from CRM [id number]. Topic: General - Other >> Sep 16, 2023  2:23 PM Oddis Bench wrote:  Reason for CRM: Brady Kim is calling from [facility] stating that they had to cancel appt for patient he has been advised.

## 2023-09-17 ENCOUNTER — Inpatient Hospital Stay: Admission: RE | Admit: 2023-09-17 | Source: Ambulatory Visit

## 2023-09-23 ENCOUNTER — Encounter: Payer: Self-pay | Admitting: Family Medicine

## 2023-12-31 ENCOUNTER — Emergency Department (HOSPITAL_COMMUNITY)

## 2023-12-31 ENCOUNTER — Emergency Department (HOSPITAL_COMMUNITY)
Admission: EM | Admit: 2023-12-31 | Discharge: 2023-12-31 | Disposition: A | Discharge status: Home / Self Care | Attending: Emergency Medicine | Admitting: Emergency Medicine

## 2023-12-31 DIAGNOSIS — I10 Essential (primary) hypertension: Secondary | ICD-10-CM | POA: Insufficient documentation

## 2023-12-31 DIAGNOSIS — F17210 Nicotine dependence, cigarettes, uncomplicated: Secondary | ICD-10-CM | POA: Insufficient documentation

## 2023-12-31 DIAGNOSIS — Z8673 Personal history of transient ischemic attack (TIA), and cerebral infarction without residual deficits: Secondary | ICD-10-CM | POA: Insufficient documentation

## 2023-12-31 DIAGNOSIS — R569 Unspecified convulsions: Secondary | ICD-10-CM | POA: Diagnosis not present

## 2023-12-31 DIAGNOSIS — Z79899 Other long term (current) drug therapy: Secondary | ICD-10-CM | POA: Insufficient documentation

## 2023-12-31 LAB — CBC WITH DIFFERENTIAL/PLATELET
Abs Immature Granulocytes: 0.03 K/uL (ref 0.00–0.07)
Basophils Absolute: 0 K/uL (ref 0.0–0.1)
Basophils Relative: 0 %
Eosinophils Absolute: 0 K/uL (ref 0.0–0.5)
Eosinophils Relative: 0 %
HCT: 44.4 % (ref 39.0–52.0)
Hemoglobin: 15.2 g/dL (ref 13.0–17.0)
Immature Granulocytes: 0 %
Lymphocytes Relative: 8 %
Lymphs Abs: 0.8 K/uL (ref 0.7–4.0)
MCH: 32.1 pg (ref 26.0–34.0)
MCHC: 34.2 g/dL (ref 30.0–36.0)
MCV: 93.9 fL (ref 80.0–100.0)
Monocytes Absolute: 0.4 K/uL (ref 0.1–1.0)
Monocytes Relative: 4 %
Neutro Abs: 8.8 K/uL — ABNORMAL HIGH (ref 1.7–7.7)
Neutrophils Relative %: 88 %
Platelets: 193 K/uL (ref 150–400)
RBC: 4.73 MIL/uL (ref 4.22–5.81)
RDW: 13.7 % (ref 11.5–15.5)
WBC: 10.1 K/uL (ref 4.0–10.5)
nRBC: 0 % (ref 0.0–0.2)

## 2023-12-31 LAB — MAGNESIUM: Magnesium: 1.9 mg/dL (ref 1.7–2.4)

## 2023-12-31 LAB — I-STAT CHEM 8, ED
BUN: 11 mg/dL (ref 6–20)
Calcium, Ion: 1.08 mmol/L — ABNORMAL LOW (ref 1.15–1.40)
Chloride: 108 mmol/L (ref 98–111)
Creatinine, Ser: 1.1 mg/dL (ref 0.61–1.24)
Glucose, Bld: 101 mg/dL — ABNORMAL HIGH (ref 70–99)
HCT: 45 % (ref 39.0–52.0)
Hemoglobin: 15.3 g/dL (ref 13.0–17.0)
Potassium: 4.1 mmol/L (ref 3.5–5.1)
Sodium: 139 mmol/L (ref 135–145)
TCO2: 21 mmol/L — ABNORMAL LOW (ref 22–32)

## 2023-12-31 LAB — COMPREHENSIVE METABOLIC PANEL WITH GFR
ALT: 48 U/L — ABNORMAL HIGH (ref 0–44)
AST: 73 U/L — ABNORMAL HIGH (ref 15–41)
Albumin: 4 g/dL (ref 3.5–5.0)
Alkaline Phosphatase: 69 U/L (ref 38–126)
Anion gap: 14 (ref 5–15)
BUN: 9 mg/dL (ref 6–20)
CO2: 18 mmol/L — ABNORMAL LOW (ref 22–32)
Calcium: 8.9 mg/dL (ref 8.9–10.3)
Chloride: 106 mmol/L (ref 98–111)
Creatinine, Ser: 1.06 mg/dL (ref 0.61–1.24)
GFR, Estimated: >60 mL/min (ref >60)
Glucose, Bld: 100 mg/dL — ABNORMAL HIGH (ref 70–99)
Potassium: 3.8 mmol/L (ref 3.5–5.1)
Sodium: 138 mmol/L (ref 135–145)
Total Bilirubin: 1 mg/dL (ref 0.0–1.2)
Total Protein: 7 g/dL (ref 6.5–8.1)

## 2023-12-31 LAB — ETHANOL: Alcohol, Ethyl (B): <15 mg/dL (ref <15)

## 2023-12-31 LAB — URINALYSIS, W/ REFLEX TO CULTURE (INFECTION SUSPECTED)
Bacteria, UA: NONE SEEN
Bilirubin Urine: NEGATIVE
Glucose, UA: NEGATIVE mg/dL
Ketones, ur: NEGATIVE mg/dL
Leukocytes,Ua: NEGATIVE
Nitrite: NEGATIVE
Protein, ur: 30 mg/dL — AB
Specific Gravity, Urine: 1.02 (ref 1.005–1.030)
pH: 5 (ref 5.0–8.0)

## 2023-12-31 LAB — RAPID URINE DRUG SCREEN, HOSP PERFORMED
Amphetamines: NOT DETECTED
Barbiturates: NOT DETECTED
Benzodiazepines: NOT DETECTED
Cocaine: NOT DETECTED
Opiates: NOT DETECTED
Tetrahydrocannabinol: NOT DETECTED

## 2023-12-31 LAB — CBG MONITORING, ED: Glucose-Capillary: 110 mg/dL — ABNORMAL HIGH (ref 70–99)

## 2023-12-31 MED ORDER — ACETAMINOPHEN 500 MG PO TABS
1000.0000 mg | ORAL_TABLET | Freq: Once | ORAL | Status: AC
  Administered 2023-12-31: 1000 mg via ORAL
  Filled 2023-12-31: qty 2

## 2023-12-31 MED ORDER — SODIUM CHLORIDE 0.9 % IV BOLUS
1000.0000 mL | Freq: Once | INTRAVENOUS | Status: AC
  Administered 2023-12-31: 1000 mL via INTRAVENOUS

## 2023-12-31 NOTE — ED Provider Notes (Signed)
  EMERGENCY DEPARTMENT AT [facility] Provider Note  CSN: [id number] Arrival date & time: 12/31/23 9086  Chief Complaint(s) Seizures  HPI Brady Kim is a 58 y.o. male history of prior seizures, alcohol use, prior stroke, hypertension presenting with seizure.  Patient apparently was at home with family, family heard some loud noises, saw patient having possible seizure for 3 to 5 minutes.  EMS said patient was postictal.  Patient does not remember the event.  He feels mostly back to normal but reports mild headache, was incontinent, has laceration to tongue.  He is not sure if he takes any seizure medicines.  Denies any other recent illness or symptoms like fevers or chills, cough, chest pain, shortness of breath, abdominal pain, neck pain or back pain, painful urination.  Denies any numbness or tingling, weakness.   Past Medical History Past Medical History:  Diagnosis Date   Back pain    Degenerative disc disease    Hypertension    Patient Active Problem List   Diagnosis Date Noted   History of stroke 12/02/2021   Hyponatremia 11/01/2021   Syncopal episodes 10/31/2021   Alcohol withdrawal (HCC) 10/31/2021   Hypocalcemia 10/31/2021   Hypokalemia 10/31/2021   Hypomagnesemia 10/31/2021   Hypoglycemia 10/31/2021   HTN (hypertension) 10/31/2021   Substance induced mood disorder (HCC)    Alcohol use disorder, severe, dependence (HCC) 02/21/2015   Severe major depression, single episode, without psychotic features (HCC) 02/21/2015   Right knee pain 06/27/2013   Tobacco use disorder 06/27/2013   Chronic low back pain 03/16/2013   Home Medication(s) Prior to Admission medications   Medication Sig Start Date End Date Taking? Authorizing Provider  amLODipine  (NORVASC ) 5 MG tablet Take 1 tablet (5 mg total) by mouth daily. 09/08/23   Newlin, Enobong, MD  atorvastatin  (LIPITOR) 20 MG tablet Take 1 tablet (20 mg total) by mouth daily. 09/08/23   Newlin, Enobong, MD   buPROPion  (WELLBUTRIN  XL) 150 MG 24 hr tablet Take 1 tablet (150 mg total) by mouth daily. 09/08/23   Newlin, Enobong, MD  Cholecalciferol  (VITAMIN D ) 50 MCG (2000 UT) CAPS Take 1 capsule (2,000 Units total) by mouth daily. 11/02/21   Gonfa, Taye T, MD  DULoxetine  (CYMBALTA ) 60 MG capsule Take 1 capsule (60 mg total) by mouth daily. For chronic pain 09/08/23   Delbert Clam, MD  lidocaine  (LIDODERM ) 5 % Place 1 patch onto the skin daily. Remove & Discard patch within 12 hours or as directed by MD 09/08/22   Newlin, Enobong, MD  meloxicam  (MOBIC ) 7.5 MG tablet Take 1 tablet (7.5 mg total) by mouth daily. 09/08/23   Newlin, Enobong, MD  methocarbamol  (ROBAXIN ) 750 MG tablet Take 1 tablet (750 mg total) by mouth every 8 (eight) hours as needed for muscle spasms. 09/08/22   Newlin, Enobong, MD  Multiple Vitamin (MULTIVITAMIN WITH MINERALS) TABS tablet Take 1 tablet by mouth daily. 11/02/21   Gonfa, Taye T, MD  nicotine  (NICODERM CQ  - DOSED IN MG/24 HOURS) 21 mg/24hr patch Place 1 patch (21 mg total) onto the skin daily as needed (Craving cigarettes). Patient not taking: Reported on 09/08/2023 11/02/21   Gonfa, Taye T, MD  thiamine  100 MG tablet Take 1 tablet (100 mg total) by mouth daily. 11/02/21   Gonfa, Taye T, MD  Past Surgical History Past Surgical History:  Procedure Laterality Date   ANKLE FUSION     KNEE ARTHROSCOPY     Family History Family History  Problem Relation Age of Onset   Hypertension Mother    Diabetes Maternal Aunt    Cancer Maternal Aunt    Cancer Maternal Grandmother     Social History Social History   Tobacco Use   Smoking status: Every Day    Current packs/day: 1.00    Types: Cigarettes   Smokeless tobacco: Never  Vaping Use   Vaping status: Never Used  Substance Use Topics   Alcohol use: Yes    Alcohol/week: 24.0 standard drinks of alcohol     Types: 24 Cans of beer per week   Drug use: No   Allergies Patient has no known allergies.  Review of Systems Review of Systems  All other systems reviewed and are negative.   Physical Exam Vital Signs  I have reviewed the triage vital signs BP (!) 127/91 (BP Location: Right Arm)   Pulse (!) 106   Temp 99 F (37.2 C) (Oral)   Resp 19   SpO2 100%  Physical Exam Vitals and nursing note reviewed.  Constitutional:      General: He is not in acute distress.    Appearance: Normal appearance.  HENT:     Mouth/Throat:     Mouth: Mucous membranes are moist.  Eyes:     Conjunctiva/sclera: Conjunctivae normal.  Cardiovascular:     Rate and Rhythm: Normal rate and regular rhythm.  Pulmonary:     Effort: Pulmonary effort is normal. No respiratory distress.     Breath sounds: Normal breath sounds.  Abdominal:     General: Abdomen is flat.     Palpations: Abdomen is soft.     Tenderness: There is no abdominal tenderness.  Musculoskeletal:     Right lower leg: No edema.     Left lower leg: No edema.     Comments: No midline C, T, L-spine tenderness.  No chest wall tenderness or crepitus.  Full painless range of motion at the bilateral upper extremities including the shoulders, elbows, wrists, hand and fingers, and in the bilateral lower extremities including the hips, knees, ankle, toes.  No focal bony tenderness, injury or deformity.  Skin:    General: Skin is warm and dry.     Capillary Refill: Capillary refill takes less than 2 seconds.  Neurological:     Mental Status: He is alert and oriented to person, place, and time. Mental status is at baseline.     Comments: Cranial nerves II through XII intact, strength 5 out of 5 in the bilateral upper and lower extremities, no sensory deficit to light touch, no dysmetria on finger-nose-finger testing, ambulatory with steady gait.  Psychiatric:        Mood and Affect: Mood normal.        Behavior: Behavior normal.     ED Results  and Treatments Labs (all labs ordered are listed, but only abnormal results are displayed) Labs Reviewed  COMPREHENSIVE METABOLIC PANEL WITH GFR - Abnormal; Notable for the following components:      Result Value   CO2 18 (*)    Glucose, Bld 100 (*)    AST 73 (*)    ALT 48 (*)    All other components within normal limits  CBC WITH DIFFERENTIAL/PLATELET - Abnormal; Notable for the following components:   Neutro Abs 8.8 (*)    All other  components within normal limits  URINALYSIS, W/ REFLEX TO CULTURE (INFECTION SUSPECTED) - Abnormal; Notable for the following components:   APPearance TURBID (*)    Hgb urine dipstick MODERATE (*)    Protein, ur 30 (*)    All other components within normal limits  CBG MONITORING, ED - Abnormal; Notable for the following components:   Glucose-Capillary 110 (*)    All other components within normal limits  I-STAT CHEM 8, ED - Abnormal; Notable for the following components:   Glucose, Bld 101 (*)    Calcium , Ion 1.08 (*)    TCO2 21 (*)    All other components within normal limits  RAPID URINE DRUG SCREEN, HOSP PERFORMED  ETHANOL  MAGNESIUM                                                                                                                           Radiology CT Head Wo Contrast Result Date: 12/31/2023 EXAM: CT HEAD WITHOUT CONTRAST 12/31/2023 11:14:08 AM TECHNIQUE: CT of the head was performed without the administration of intravenous contrast. Automated exposure control, iterative reconstruction, and/or weight based adjustment of the mA/kV was utilized to reduce the radiation dose to as low as reasonably achievable. COMPARISON: CT of the head dated 10/31/2021. CLINICAL HISTORY: Head trauma, moderate-severe. Seizures. FINDINGS: BRAIN AND VENTRICLES: No acute hemorrhage. No evidence of acute infarct. No hydrocephalus. No extra-axial collection. No mass effect or midline shift. Chronic encephalomalacia changes within the right frontal, parietal,  and temporal lobes. Age-related cerebral and cerebellar atrophy. ORBITS: No acute abnormality. SINUSES: Polypoid mucosal disease within the right sphenoid sinus. SOFT TISSUES AND SKULL: No acute soft tissue abnormality. No skull fracture. IMPRESSION: 1. No acute intracranial abnormality. 2. Chronic encephalomalacia changes within the right frontal, parietal, and temporal lobes. 3. Age-related serial and cerebellar atrophy. 4. Polypoid mucosal disease within the sphenoid sinus on the right. Electronically signed by: Evalene Coho MD 12/31/2023 11:19 AM EDT RP Workstation: [id number]    Pertinent labs & imaging results that were available during my care of the patient were reviewed by me and considered in my medical decision making (see MDM for details).  Medications Ordered in ED Medications  acetaminophen  (TYLENOL ) tablet 1,000 mg (1,000 mg Oral Given 12/31/23 1053)  sodium chloride  0.9 % bolus 1,000 mL (1,000 mLs Intravenous New Bag/Given 12/31/23 1059)  Procedures Procedures  (including critical care time)  Medical Decision Making / ED Course   MDM:  58 year old male presenting to the emergency department with suspected seizure.  History does seem consistent with seizure.  He overall reports that he feels well.  His only symptom currently is mild headache.  Will obtain CT head unclear if patient hit head.  Does have a small tongue laceration.  He does have history of seizures.  Will check labs to evaluate for any underlying precipitating cause.  Will check ethanol and UDS level.  Patient does have a reported history of alcohol abuse but does not appear to be in alcohol withdrawal at this time.  No tremor.  Will reassess.  Will observe.  Clinical Course as of 12/31/23 1327  Fri Dec 31, 2023  1324 Workup is overall reassuring.  Patient says he feels well, denies  any current symptoms.  He feels back to baseline.  Headache is improved after Tylenol .  Low concern for dangerous process with reassuring workup.  Low concern for other unusual process such as CNS infection given normal mental status, no meningismus, no fevers and feeling normal currently.  Discussed with Dr.Palikh with neurology, he recommends that given no records available about possible seizure problem, and, current alcohol use, he would recommend against starting AED currently and have patient follow-up closely with neurology.  I will place neurology follow-up.  Discussed with patient is comfortable with this plan. Will discharge patient to home. All questions answered. Patient comfortable with plan of discharge. Return precautions discussed with patient and specified on the after visit summary.  [WS]    Clinical Course User Index [WS] Francesca Elsie CROME, MD     Additional history obtained: -Additional history obtained from ems -External records from outside source obtained and reviewed including: Chart review including previous notes, labs, imaging, consultation notes including prior notes    Lab Tests: -I ordered, reviewed, and interpreted labs.   The pertinent results include:   Labs Reviewed  COMPREHENSIVE METABOLIC PANEL WITH GFR - Abnormal; Notable for the following components:      Result Value   CO2 18 (*)    Glucose, Bld 100 (*)    AST 73 (*)    ALT 48 (*)    All other components within normal limits  CBC WITH DIFFERENTIAL/PLATELET - Abnormal; Notable for the following components:   Neutro Abs 8.8 (*)    All other components within normal limits  URINALYSIS, W/ REFLEX TO CULTURE (INFECTION SUSPECTED) - Abnormal; Notable for the following components:   APPearance TURBID (*)    Hgb urine dipstick MODERATE (*)    Protein, ur 30 (*)    All other components within normal limits  CBG MONITORING, ED - Abnormal; Notable for the following components:   Glucose-Capillary 110 (*)     All other components within normal limits  I-STAT CHEM 8, ED - Abnormal; Notable for the following components:   Glucose, Bld 101 (*)    Calcium , Ion 1.08 (*)    TCO2 21 (*)    All other components within normal limits  RAPID URINE DRUG SCREEN, HOSP PERFORMED  ETHANOL  MAGNESIUM     Notable for mild lft elevation, likely due to alcohol use, discussed with patient, encouraged limiting alcohol use   EKG   EKG Interpretation Date/Time:  Friday December 31 2023 09:34:31 EDT Ventricular Rate:  95 PR Interval:  141 QRS Duration:  82 QT Interval:  343 QTC Calculation: 432 R Axis:  Text Interpretation: Normal sinus rhythm Left axis deviation Poor R wave progression inferior infarct, old Confirmed by Francesca Fallow ([id number]) on 12/31/2023 10:42:39 AM         Imaging Studies ordered: I ordered imaging studies including CT head  On my interpretation imaging demonstrates no acute process I independently visualized and interpreted imaging. I agree with the radiologist interpretation   Medicines ordered and prescription drug management: Meds ordered this encounter  Medications   acetaminophen  (TYLENOL ) tablet 1,000 mg   sodium chloride  0.9 % bolus 1,000 mL    -I have reviewed the patients home medicines and have made adjustments as needed   Consultations Obtained: I requested consultation with the neurologist,  and discussed lab and imaging findings as well as pertinent plan - they recommend: outpatient follow up   Cardiac Monitoring: The patient was maintained on a cardiac monitor.  I personally viewed and interpreted the cardiac monitored which showed an underlying rhythm of: NSR  Social Determinants of Health:  Diagnosis or treatment significantly limited by social determinants of health: alcohol use   Reevaluation: After the interventions noted above, I reevaluated the patient and found that their symptoms have resolved  Co morbidities that complicate the  patient evaluation  Past Medical History:  Diagnosis Date   Back pain    Degenerative disc disease    Hypertension       Dispostion: Disposition decision including need for hospitalization was considered, and patient discharged from emergency department.    Final Clinical Impression(s) / ED Diagnoses Final diagnoses:  Seizure [facility])     This chart was dictated using voice recognition software.  Despite best efforts to proofread,  errors can occur which can change the documentation meaning.    Francesca Fallow CROME, MD 12/31/23 [phone]

## 2023-12-31 NOTE — ED Triage Notes (Signed)
 Saturation , Pulse  Pt BIB [facility] from home for seizure. EMS reported family heard banging on the walls. He had tonic/clonic seizure  for 3 to 5 minutes. Last time patient had a seizure  was 7 years ago. EMS said patient was posticle on arrival. Patient did not fall, but defecated on his self. Vital signs: BP 129/89, Pulse 99, Saturation 96%, CBG 143.

## 2023-12-31 NOTE — Discharge Instructions (Addendum)
 We evaluated you for your seizure.  We checked your testing in the emergency department and it was reassuring.  We discussed your symptoms with the neurologist who did not think you need to start taking a seizure medicine today.  We do think that you need to follow-up with a neurologist.  We have placed a referral for a neurology appointment.  They should call you sometime early next week.  If you do have any further episodes of seizures or any other new symptoms like fevers or chills, severe headaches, lightheadedness or dizziness, please return to the emergency department for reassessment.  Per [place] statutes, patients with seizures are not allowed to drive until  they have been seizure-free for six months. Use caution when using heavy equipment or power tools. Avoid working on ladders or at heights. Take showers instead of baths. Ensure the water temperature is not too high on the home water heater. Do not go swimming alone. When caring for infants or small children, sit down when holding, feeding, or changing them to minimize risk of injury to the child in the event you have a seizure.

## 2024-02-06 ENCOUNTER — Other Ambulatory Visit: Payer: Self-pay | Admitting: Family Medicine

## 2024-02-06 DIAGNOSIS — I10 Essential (primary) hypertension: Secondary | ICD-10-CM

## 2024-04-03 ENCOUNTER — Ambulatory Visit: Attending: Family Medicine | Admitting: Family Medicine

## 2024-04-03 ENCOUNTER — Encounter: Payer: Self-pay | Admitting: Family Medicine

## 2024-04-03 VITALS — BP 130/87 | HR 74 | Wt 155.4 lb

## 2024-04-03 DIAGNOSIS — Z8673 Personal history of transient ischemic attack (TIA), and cerebral infarction without residual deficits: Secondary | ICD-10-CM

## 2024-04-03 DIAGNOSIS — J069 Acute upper respiratory infection, unspecified: Secondary | ICD-10-CM

## 2024-04-03 DIAGNOSIS — R0789 Other chest pain: Secondary | ICD-10-CM

## 2024-04-03 DIAGNOSIS — I10 Essential (primary) hypertension: Secondary | ICD-10-CM

## 2024-04-03 DIAGNOSIS — M5136 Other intervertebral disc degeneration, lumbar region with discogenic back pain only: Secondary | ICD-10-CM

## 2024-04-03 DIAGNOSIS — Z23 Encounter for immunization: Secondary | ICD-10-CM

## 2024-04-03 DIAGNOSIS — F17210 Nicotine dependence, cigarettes, uncomplicated: Secondary | ICD-10-CM

## 2024-04-03 DIAGNOSIS — Z1211 Encounter for screening for malignant neoplasm of colon: Secondary | ICD-10-CM

## 2024-04-03 MED ORDER — AMLODIPINE BESYLATE 5 MG PO TABS: 5.0000 mg | ORAL_TABLET | Freq: Every day | ORAL | 1 refills | Status: AC

## 2024-04-03 MED ORDER — ATORVASTATIN CALCIUM 20 MG PO TABS: 20.0000 mg | ORAL_TABLET | Freq: Every day | ORAL | 1 refills | Status: AC

## 2024-04-03 NOTE — Progress Notes (Signed)
 Subjective:  Patient ID: Brady Kim, male    DOB: [birth year]  Age: 58 y.o. MRN: [id number]  CC: Medical Management of Chronic Issues     Discussed the use of AI scribe software for clinical note transcription with the patient, who gave verbal consent to proceed.  History of Present Illness Brady Kim is a 58 year old male with a history of nicotine  dependence (10 cigarettes/day x 40 years), alcohol dependence, seizures, diabetes degenerative disc disease of lumbar spine, MCA infarct (from CT head 10/2021). who presents for follow-up of his medical conditions.  He has intermittent mild night sweats and runny nose that he links to recent temperature changes. Symptoms are sporadic without fever, chills, body aches, or persistent cough.  He smokes more than one pack per day and has done so for over twenty years. He is not taking previously prescribed Wellbutrin  for smoking cessation.  He takes amlodipine  for hypertension and atorvastatin  for hyperlipidemia.  He has intermittent back pain from degenerative disc disease that is affected by sleeping position. He uses pain medication, a muscle relaxant, meloxicam , and duloxetine  as needed and does not need refills today.  For the past two to three months he has had right-sided rib pain that worsens when lying on that side. He denies recent trauma.  He has had a stroke in the past with no residual deficits and denies recent seizures.    Past Medical History:  Diagnosis Date   Back pain    Degenerative disc disease    Hypertension     Past Surgical History:  Procedure Laterality Date   ANKLE FUSION     KNEE ARTHROSCOPY      Family History  Problem Relation Age of Onset   Hypertension Mother    Diabetes Maternal Aunt    Cancer Maternal Aunt    Cancer Maternal Grandmother     Social History   Socioeconomic History   Marital status: Married    Spouse name: Not on file   Number of children: Not on file   Years of  education: Not on file   Highest education level: 10th grade  Occupational History   Not on file  Tobacco Use   Smoking status: Every Day    Current packs/day: 1.00    Types: Cigarettes   Smokeless tobacco: Never  Vaping Use   Vaping status: Never Used  Substance and Sexual Activity   Alcohol use: Yes    Alcohol/week: 24.0 standard drinks of alcohol    Types: 24 Cans of beer per week   Drug use: No   Sexual activity: Not on file  Other Topics Concern   Not on file  Social History Narrative   Not on file   Social Drivers of Health   Financial Resource Strain: Low Risk  (09/08/2023)   Overall Financial Resource Strain (CARDIA)    Difficulty of Paying Living Expenses: Not hard at all  Recent Concern: Financial Resource Strain - Medium Risk (09/07/2023)   Overall Financial Resource Strain (CARDIA)    Difficulty of Paying Living Expenses: Somewhat hard  Food Insecurity: No Food Insecurity (09/08/2023)   Hunger Vital Sign    Worried About Running Out of Food in the Last Year: Never true    Ran Out of Food in the Last Year: Never true  Recent Concern: Food Insecurity - Food Insecurity Present (09/07/2023)   Hunger Vital Sign    Worried About Running Out of Food in the Last Year: Sometimes true  Ran Out of Food in the Last Year: Sometimes true  Transportation Needs: No Transportation Needs (09/08/2023)   PRAPARE - Administrator, Civil Service (Medical): No    Lack of Transportation (Non-Medical): No  Physical Activity: Inactive (09/07/2023)   Exercise Vital Sign    Days of Exercise per Week: 1 day    Minutes of Exercise per Session: 0 min  Stress: No Stress Concern Present (09/07/2023)   [employer] of Occupational Health - Occupational Stress Questionnaire    Feeling of Stress : Only a little  Social Connections: Moderately Integrated (09/08/2023)   Social Connection and Isolation Panel    Frequency of Communication with Friends and Family: More than three  times a week    Frequency of Social Gatherings with Friends and Family: Never    Attends Religious Services: 1 to 4 times per year    Active Member of [employer] or Organizations: Yes    Attends Banker Meetings: Never    Marital Status: Separated    No Known Allergies  Outpatient Medications Prior to Visit  Medication Sig Dispense Refill   buPROPion  (WELLBUTRIN  XL) 150 MG 24 hr tablet Take 1 tablet (150 mg total) by mouth daily. 90 tablet 1   DULoxetine  (CYMBALTA ) 60 MG capsule Take 1 capsule (60 mg total) by mouth daily. For chronic pain 90 capsule 1   lidocaine  (LIDODERM ) 5 % Place 1 patch onto the skin daily. Remove & Discard patch within 12 hours or as directed by MD 30 patch 0   meloxicam  (MOBIC ) 7.5 MG tablet Take 1 tablet (7.5 mg total) by mouth daily. 30 tablet 2   methocarbamol  (ROBAXIN ) 750 MG tablet Take 1 tablet (750 mg total) by mouth every 8 (eight) hours as needed for muscle spasms. 90 tablet 1   nicotine  (NICODERM CQ  - DOSED IN MG/24 HOURS) 21 mg/24hr patch Place 1 patch (21 mg total) onto the skin daily as needed (Craving cigarettes). 28 patch 0   thiamine  100 MG tablet Take 1 tablet (100 mg total) by mouth daily. 30 tablet 1   amLODipine  (NORVASC ) 5 MG tablet TAKE 1 TABLET BY MOUTH EVERY DAY 90 tablet 0   atorvastatin  (LIPITOR) 20 MG tablet Take 1 tablet (20 mg total) by mouth daily. 90 tablet 1   Cholecalciferol  (VITAMIN D ) 50 MCG (2000 UT) CAPS Take 1 capsule (2,000 Units total) by mouth daily. (Patient not taking: Reported on 04/03/2024) 30 capsule    Multiple Vitamin (MULTIVITAMIN WITH MINERALS) TABS tablet Take 1 tablet by mouth daily. (Patient not taking: Reported on 04/03/2024)     No facility-administered medications prior to visit.     ROS Review of Systems  Constitutional:  Negative for activity change and appetite change.  HENT:  Positive for rhinorrhea. Negative for sinus pressure and sore throat.   Respiratory:  Negative for chest tightness,  shortness of breath and wheezing.   Cardiovascular:  Negative for chest pain and palpitations.  Gastrointestinal:  Negative for abdominal distention, abdominal pain and constipation.  Genitourinary: Negative.   Musculoskeletal:        See HPI  Psychiatric/Behavioral:  Negative for behavioral problems and dysphoric mood.     Objective:  BP 130/87 (BP Location: Right Arm, Patient Position: Sitting, Cuff Size: Normal)   Pulse 74   Wt 155 lb 6.4 oz (70.5 kg)   SpO2 98%   BMI 21.67 kg/m      04/03/2024    8:49 AM 12/31/2023    2:17  PM 12/31/2023    9:36 AM  BP/Weight  Systolic BP 130 -- 127  Diastolic BP 87 -- 91  Wt. (Lbs) 155.4    BMI 21.67 kg/m2        Physical Exam Constitutional:      Appearance: He is well-developed.  Cardiovascular:     Rate and Rhythm: Normal rate.     Heart sounds: Normal heart sounds. No murmur heard. Pulmonary:     Effort: Pulmonary effort is normal.     Breath sounds: Normal breath sounds. No wheezing or rales.  Chest:     Chest wall: No tenderness.  Abdominal:     General: Bowel sounds are normal. There is no distension.     Palpations: Abdomen is soft. There is no mass.     Tenderness: There is no abdominal tenderness.  Musculoskeletal:        General: Tenderness (slight TTP of lower border of R ribcage) present. Normal range of motion.     Right lower leg: No edema.     Left lower leg: No edema.  Neurological:     Mental Status: He is alert and oriented to person, place, and time.  Psychiatric:        Mood and Affect: Mood normal.        Latest Ref Rng & Units 12/31/2023   10:33 AM 12/31/2023   10:22 AM 09/08/2023   11:21 AM  CMP  Glucose 70 - 99 mg/dL 898  899  86   BUN 6 - 20 mg/dL 11  9  10    Creatinine 0.61 - 1.24 mg/dL 8.89  8.93  9.15   Sodium 135 - 145 mmol/L 139  138  141   Potassium 3.5 - 5.1 mmol/L 4.1  3.8  4.7   Chloride 98 - 111 mmol/L 108  106  104   CO2 22 - 32 mmol/L  18  23   Calcium  8.9 - 10.3 mg/dL  8.9  9.1    Total Protein 6.5 - 8.1 g/dL  7.0  7.1   Total Bilirubin 0.0 - 1.2 mg/dL  1.0  0.3   Alkaline Phos 38 - 126 U/L  69  99   AST 15 - 41 U/L  73  30   ALT 0 - 44 U/L  48  32     Lipid Panel     Component Value Date/Time   CHOL 146 09/08/2023 1121   TRIG 158 (H) 09/08/2023 1121   HDL 89 09/08/2023 1121   CHOLHDL 2.1 07/18/2020 1408   CHOLHDL 2.2 03/14/2013 0935   VLDL 20 03/14/2013 0935   LDLCALC 32 09/08/2023 1121    CBC    Component Value Date/Time   WBC 10.1 12/31/2023 1022   RBC 4.73 12/31/2023 1022   HGB 15.3 12/31/2023 1033   HGB 14.8 09/08/2022 1341   HCT 45.0 12/31/2023 1033   HCT 44.1 09/08/2022 1341   PLT 193 12/31/2023 1022   PLT 180 09/08/2022 1341   MCV 93.9 12/31/2023 1022   MCV 92 09/08/2022 1341   MCH 32.1 12/31/2023 1022   MCHC 34.2 12/31/2023 1022   RDW 13.7 12/31/2023 1022   RDW 13.6 09/08/2022 1341   LYMPHSABS 0.8 12/31/2023 1022   LYMPHSABS 1.5 09/08/2022 1341   MONOABS 0.4 12/31/2023 1022   EOSABS 0.0 12/31/2023 1022   EOSABS 0.1 09/08/2022 1341   BASOSABS 0.0 12/31/2023 1022   BASOSABS 0.0 09/08/2022 1341    Lab Results  Component Value Date  HGBA1C 5.4 09/08/2023       Assessment & Plan Right rib pain Intermittent right rib pain for 2-3 months, exacerbated by certain positions during sleep. Differential includes rib fracture or metastatic disease. - Ordered CT scan of the lungs for lung cancer screening and he has been advised to complete that as we will get the picture of his ribs as well - Provided contact number for scheduling CT scan.  Upper respiratory infection Intermittent runny nose and night sweats, likely due to upper respiratory infection. Symptoms are not persistent, reducing concern for tuberculosis. - Advised increased fluid intake. - Recommended use of decongestants for congestion relief.  Degeneration of intervertebral disc of lumbar region with chronic low back pain Chronic low back pain due to degenerative disc  disease, exacerbated by certain sleeping positions. Pain is intermittent and managed with occasional use of pain medications and muscle relaxants. - Continue current pain management regimen as needed.  Primary hypertension Blood pressure is well-controlled with current medication regimen. - Continue current antihypertensive medication regimen. -Counseled on blood pressure goal of less than 130/80, low-sodium, DASH diet, medication compliance, 150 minutes of moderate intensity exercise per week. Discussed medication compliance, adverse effects.   History of stroke Continued management with atorvastatin  to reduce stroke risk. Smoking and alcohol use increase risk. -No residual deficits - Continue atorvastatin  for cholesterol management. - Advised on smoking cessation and alcohol reduction to decrease stroke risk.  Smoking greater than 20-pack-year history Continued smoking despite previous prescription of Wellbutrin  for smoking cessation. No current plans to quit smoking. - Provided contact number for scheduling CT scan for lung cancer screening due to smoking history. -Smoking cessation support: smoking cessation hotline: 1-800-QUIT-NOW.  Smoking cessation classes are available through [facility]. Call [phone] or visit our website at [email or web address].  Spent 3 minutes counseling on dangers of tobacco use and benefits of quitting, offered pharmacological intervention to aid quitting and patient is not ready to quit.   General health maintenance No prior colonoscopy despite being over 89 years old. Importance of early detection of colon cancer discussed. Screening for colon cancer- Referred for colonoscopy for colon cancer screening. - Sent prescription to pharmacy. Encounter for immunization-flu shot administered       Meds ordered this encounter  Medications   amLODipine  (NORVASC ) 5 MG tablet    Sig: Take 1 tablet (5 mg total) by mouth daily.     Dispense:  90 tablet    Refill:  1   atorvastatin  (LIPITOR) 20 MG tablet    Sig: Take 1 tablet (20 mg total) by mouth daily.    Dispense:  90 tablet    Refill:  1    Follow-up: Return in about 6 months (around 10/02/2024) for Chronic medical conditions.       Corrina Sabin, MD, FAAFP. [facility] [place], [place] [phone]   04/03/2024, 12:01 PM

## 2024-04-03 NOTE — Patient Instructions (Signed)
 VISIT SUMMARY:  Today, we discussed your ongoing medical conditions and addressed some new symptoms you have been experiencing. We reviewed your medications and made some recommendations for further tests and lifestyle changes.  YOUR PLAN:  -RIGHT RIB PAIN: You have been experiencing intermittent right rib pain for the past 2-3 months, which worsens when you lie on that side. This could be due to a rib fracture or other serious conditions. We have ordered a CT scan of your lungs to investigate further. Please schedule this scan as soon as possible using the contact number provided.  -UPPER RESPIRATORY INFECTION: Your intermittent runny nose and night sweats are likely due to an upper respiratory infection. This is a common condition that affects the nose and throat. We recommend increasing your fluid intake and using decongestants to relieve congestion.  -DEGENERATION OF INTERVERTEBRAL DISC OF LUMBAR REGION WITH CHRONIC LOW BACK PAIN: Your chronic low back pain is due to degenerative disc disease, which is a condition where the discs in your spine break down over time. Continue using your current pain medications and muscle relaxants as needed to manage the pain.  -PRIMARY HYPERTENSION: Your blood pressure is well-controlled with your current medication. Hypertension is a condition where the force of the blood against your artery walls is too high. Continue taking your antihypertensive medications as prescribed.  -HISTORY OF STROKE: You have a history of stroke, and we are continuing to manage your condition with atorvastatin  to reduce the risk of another stroke. A stroke occurs when the blood supply to part of your brain is interrupted. We also discussed the importance of quitting smoking and reducing alcohol consumption to lower your risk.  -NICOTINE  DEPENDENCE, CIGARETTES: You continue to smoke despite previous attempts to quit. Nicotine  dependence is an addiction to tobacco products. We provided  you with a contact number to schedule a CT scan for lung cancer screening due to your smoking history.  -GENERAL HEALTH MAINTENANCE: You have not had a colonoscopy yet, which is important for early detection of colon cancer. We have referred you for a colonoscopy and sent the prescription to your pharmacy.  INSTRUCTIONS:  Please schedule your CT scan for your right rib pain and lung cancer screening using the contact number provided. Also, schedule a colonoscopy for colon cancer screening. Continue taking your current medications as prescribed and follow the lifestyle recommendations discussed. If you have any questions or concerns, please contact our office.

## 2024-05-24 ENCOUNTER — Telehealth: Payer: Self-pay | Admitting: *Deleted

## 2024-05-24 DIAGNOSIS — F1994 Other psychoactive substance use, unspecified with psychoactive substance-induced mood disorder: Secondary | ICD-10-CM

## 2024-05-24 DIAGNOSIS — F322 Major depressive disorder, single episode, severe without psychotic features: Secondary | ICD-10-CM

## 2024-05-24 DIAGNOSIS — F1020 Alcohol dependence, uncomplicated: Secondary | ICD-10-CM

## 2024-05-24 DIAGNOSIS — F17200 Nicotine dependence, unspecified, uncomplicated: Secondary | ICD-10-CM

## 2024-05-24 NOTE — Progress Notes (Unsigned)
 Complex Care Management Note Care Guide Note  05/24/2024 Name: Brady Kim MRN: [id number] DOB: 1966-02-14   Complex Care Management Outreach Attempts: An unsuccessful telephone outreach was attempted today to offer the patient information about available complex care management services.  Follow Up Plan:  Additional outreach attempts will be made to offer the patient complex care management information and services.   Encounter Outcome:  No Answer  Harlene Satterfield  [facility]  [facility] Guide  Direct Dial: [phone]  Fax [phone]

## 2024-05-25 NOTE — Progress Notes (Signed)
 Complex Care Management Note  Care Guide Note 05/25/2024 Name: Abdo Denault MRN: [id number] DOB: April 05, 1966  Camp Gopal is a 59 y.o. year old male who sees Delbert Clam, MD for primary care. I reached out to Lyndy Ada by phone today to offer complex care management services.  Mr. Costabile was given information about Complex Care Management services today including:   The Complex Care Management services include support from the care team which includes your Nurse Care Manager, Clinical Social Worker, or Pharmacist.  The Complex Care Management team is here to help remove barriers to the health concerns and goals most important to you. Complex Care Management services are voluntary, and the patient may decline or stop services at any time by request to their care team member.   Complex Care Management Consent Status: Patient agreed to services and verbal consent obtained.   Follow up plan:  Telephone appointment with complex care management team member scheduled for:  06/05/2024     Encounter Outcome:  Patient Scheduled  Harlene Satterfield  [facility]  [facility] Guide  Direct Dial: [phone]  Fax [phone]

## 2024-05-29 ENCOUNTER — Encounter: Payer: Self-pay | Admitting: Gastroenterology

## 2024-06-05 ENCOUNTER — Telehealth

## 2024-10-02 ENCOUNTER — Ambulatory Visit: Admitting: Family Medicine
